# Patient Record
Sex: Female | Born: 1960 | Hispanic: No | Marital: Married | Smoking: Never smoker
Health system: Southern US, Community
[De-identification: ages and names within clinical notes are randomized; demographics above are authoritative.]

## PROBLEM LIST (undated history)

## (undated) DIAGNOSIS — I10 Essential (primary) hypertension: Secondary | ICD-10-CM

## (undated) DIAGNOSIS — N6019 Diffuse cystic mastopathy of unspecified breast: Secondary | ICD-10-CM

## (undated) DIAGNOSIS — K5900 Constipation, unspecified: Secondary | ICD-10-CM

## (undated) DIAGNOSIS — M8580 Other specified disorders of bone density and structure, unspecified site: Secondary | ICD-10-CM

## (undated) DIAGNOSIS — E559 Vitamin D deficiency, unspecified: Secondary | ICD-10-CM

## (undated) DIAGNOSIS — K219 Gastro-esophageal reflux disease without esophagitis: Secondary | ICD-10-CM

## (undated) DIAGNOSIS — Z860100 Personal history of colon polyps, unspecified: Secondary | ICD-10-CM

## (undated) HISTORY — DX: Gastro-esophageal reflux disease without esophagitis: K21.9

## (undated) HISTORY — DX: Constipation, unspecified: K59.00

## (undated) HISTORY — PX: BREAST BIOPSY: SHX20

## (undated) HISTORY — PX: COLONOSCOPY: SHX174

## (undated) HISTORY — PX: BREAST CYST EXCISION: SHX579

## (undated) HISTORY — DX: Personal history of colonic polyps: Z86.010

## (undated) HISTORY — DX: Vitamin D deficiency, unspecified: E55.9

## (undated) HISTORY — DX: Other specified disorders of bone density and structure, unspecified site: M85.80

## (undated) HISTORY — DX: Diffuse cystic mastopathy of unspecified breast: N60.19

## (undated) HISTORY — DX: Personal history of colon polyps, unspecified: Z86.0100

---

## 1998-05-26 ENCOUNTER — Other Ambulatory Visit: Admission: RE | Admit: 1998-05-26 | Discharge: 1998-05-26 | Payer: Self-pay

## 1999-07-26 ENCOUNTER — Other Ambulatory Visit: Admission: RE | Admit: 1999-07-26 | Discharge: 1999-07-26 | Payer: Self-pay | Admitting: Family Medicine

## 2000-06-13 ENCOUNTER — Encounter: Admission: RE | Admit: 2000-06-13 | Discharge: 2000-06-13 | Payer: Self-pay | Admitting: Family Medicine

## 2000-06-13 ENCOUNTER — Encounter: Payer: Self-pay | Admitting: Family Medicine

## 2000-11-14 ENCOUNTER — Other Ambulatory Visit: Admission: RE | Admit: 2000-11-14 | Discharge: 2000-11-14 | Payer: Self-pay | Admitting: Family Medicine

## 2002-12-18 ENCOUNTER — Encounter: Admission: RE | Admit: 2002-12-18 | Discharge: 2002-12-18 | Payer: Self-pay | Admitting: Family Medicine

## 2004-08-24 ENCOUNTER — Other Ambulatory Visit: Admission: RE | Admit: 2004-08-24 | Discharge: 2004-08-24 | Payer: Self-pay | Admitting: Family Medicine

## 2005-12-01 ENCOUNTER — Other Ambulatory Visit: Admission: RE | Admit: 2005-12-01 | Discharge: 2005-12-01 | Payer: Self-pay | Admitting: Family Medicine

## 2005-12-13 ENCOUNTER — Encounter: Admission: RE | Admit: 2005-12-13 | Discharge: 2005-12-13 | Payer: Self-pay | Admitting: Family Medicine

## 2007-02-19 ENCOUNTER — Other Ambulatory Visit: Admission: RE | Admit: 2007-02-19 | Discharge: 2007-02-19 | Payer: Self-pay | Admitting: Family Medicine

## 2007-10-25 ENCOUNTER — Encounter: Admission: RE | Admit: 2007-10-25 | Discharge: 2007-10-25 | Payer: Self-pay | Admitting: Family Medicine

## 2008-05-05 ENCOUNTER — Other Ambulatory Visit: Admission: RE | Admit: 2008-05-05 | Discharge: 2008-05-05 | Payer: Self-pay | Admitting: Family Medicine

## 2008-12-17 ENCOUNTER — Encounter: Admission: RE | Admit: 2008-12-17 | Discharge: 2008-12-17 | Payer: Self-pay | Admitting: Family Medicine

## 2010-02-01 ENCOUNTER — Encounter
Admission: RE | Admit: 2010-02-01 | Discharge: 2010-02-01 | Payer: Self-pay | Source: Home / Self Care | Attending: Family Medicine | Admitting: Family Medicine

## 2011-02-23 ENCOUNTER — Other Ambulatory Visit: Payer: Self-pay | Admitting: Family Medicine

## 2011-02-23 DIAGNOSIS — Z1231 Encounter for screening mammogram for malignant neoplasm of breast: Secondary | ICD-10-CM

## 2011-03-08 ENCOUNTER — Ambulatory Visit: Payer: Self-pay

## 2011-03-21 ENCOUNTER — Ambulatory Visit
Admission: RE | Admit: 2011-03-21 | Discharge: 2011-03-21 | Disposition: A | Discharge status: Home / Self Care | Payer: BC Managed Care – PPO | Source: Ambulatory Visit | Attending: Family Medicine | Admitting: Family Medicine

## 2011-03-21 DIAGNOSIS — Z1231 Encounter for screening mammogram for malignant neoplasm of breast: Secondary | ICD-10-CM

## 2011-05-04 ENCOUNTER — Other Ambulatory Visit (HOSPITAL_COMMUNITY)
Admission: RE | Admit: 2011-05-04 | Discharge: 2011-05-04 | Disposition: A | Discharge status: Home / Self Care | Payer: BC Managed Care – PPO | Source: Ambulatory Visit | Attending: Family Medicine | Admitting: Family Medicine

## 2011-05-04 ENCOUNTER — Other Ambulatory Visit: Payer: Self-pay | Admitting: Physician Assistant

## 2011-05-04 DIAGNOSIS — Z0000 Encounter for general adult medical examination without abnormal findings: Secondary | ICD-10-CM | POA: Insufficient documentation

## 2012-04-06 ENCOUNTER — Other Ambulatory Visit: Payer: Self-pay

## 2012-04-06 DIAGNOSIS — Z1231 Encounter for screening mammogram for malignant neoplasm of breast: Secondary | ICD-10-CM

## 2012-05-01 ENCOUNTER — Ambulatory Visit
Admission: RE | Admit: 2012-05-01 | Discharge: 2012-05-01 | Disposition: A | Discharge status: Home / Self Care | Payer: BC Managed Care – PPO | Source: Ambulatory Visit

## 2012-05-01 DIAGNOSIS — Z1231 Encounter for screening mammogram for malignant neoplasm of breast: Secondary | ICD-10-CM

## 2013-06-05 ENCOUNTER — Other Ambulatory Visit: Payer: Self-pay

## 2013-06-05 DIAGNOSIS — Z1231 Encounter for screening mammogram for malignant neoplasm of breast: Secondary | ICD-10-CM

## 2013-06-14 ENCOUNTER — Encounter (INDEPENDENT_AMBULATORY_CARE_PROVIDER_SITE_OTHER): Payer: Self-pay

## 2013-06-14 ENCOUNTER — Ambulatory Visit
Admission: RE | Admit: 2013-06-14 | Discharge: 2013-06-14 | Disposition: A | Discharge status: Home / Self Care | Payer: BC Managed Care – PPO | Source: Ambulatory Visit

## 2013-06-14 DIAGNOSIS — Z1231 Encounter for screening mammogram for malignant neoplasm of breast: Secondary | ICD-10-CM

## 2014-03-06 ENCOUNTER — Emergency Department (HOSPITAL_COMMUNITY)
Admission: EM | Admit: 2014-03-06 | Discharge: 2014-03-06 | Disposition: A | Discharge status: Home / Self Care | Payer: BLUE CROSS/BLUE SHIELD | Attending: Emergency Medicine | Admitting: Emergency Medicine

## 2014-03-06 ENCOUNTER — Emergency Department (HOSPITAL_COMMUNITY): Payer: BLUE CROSS/BLUE SHIELD

## 2014-03-06 ENCOUNTER — Encounter (HOSPITAL_COMMUNITY): Payer: Self-pay | Admitting: Emergency Medicine

## 2014-03-06 DIAGNOSIS — S0990XA Unspecified injury of head, initial encounter: Secondary | ICD-10-CM | POA: Diagnosis present

## 2014-03-06 DIAGNOSIS — Y998 Other external cause status: Secondary | ICD-10-CM | POA: Insufficient documentation

## 2014-03-06 DIAGNOSIS — W1830XA Fall on same level, unspecified, initial encounter: Secondary | ICD-10-CM | POA: Insufficient documentation

## 2014-03-06 DIAGNOSIS — Y92009 Unspecified place in unspecified non-institutional (private) residence as the place of occurrence of the external cause: Secondary | ICD-10-CM | POA: Diagnosis not present

## 2014-03-06 DIAGNOSIS — S0081XA Abrasion of other part of head, initial encounter: Secondary | ICD-10-CM | POA: Insufficient documentation

## 2014-03-06 DIAGNOSIS — E876 Hypokalemia: Secondary | ICD-10-CM | POA: Insufficient documentation

## 2014-03-06 DIAGNOSIS — Y9389 Activity, other specified: Secondary | ICD-10-CM | POA: Diagnosis not present

## 2014-03-06 DIAGNOSIS — W19XXXA Unspecified fall, initial encounter: Secondary | ICD-10-CM

## 2014-03-06 DIAGNOSIS — I10 Essential (primary) hypertension: Secondary | ICD-10-CM | POA: Diagnosis not present

## 2014-03-06 DIAGNOSIS — S0083XA Contusion of other part of head, initial encounter: Secondary | ICD-10-CM | POA: Diagnosis not present

## 2014-03-06 DIAGNOSIS — T502X5A Adverse effect of carbonic-anhydrase inhibitors, benzothiadiazides and other diuretics, initial encounter: Secondary | ICD-10-CM

## 2014-03-06 HISTORY — DX: Essential (primary) hypertension: I10

## 2014-03-06 LAB — BASIC METABOLIC PANEL
Anion gap: 8 (ref 5–15)
BUN: 15 mg/dL (ref 6–23)
CALCIUM: 9.2 mg/dL (ref 8.4–10.5)
CO2: 29 mmol/L (ref 19–32)
CREATININE: 0.75 mg/dL (ref 0.50–1.10)
Chloride: 100 mmol/L (ref 96–112)
GFR calc non Af Amer: >90 mL/min (ref >90)
Glucose, Bld: 117 mg/dL — ABNORMAL HIGH (ref 70–99)
Potassium: 3 mmol/L — ABNORMAL LOW (ref 3.5–5.1)
Sodium: 137 mmol/L (ref 135–145)

## 2014-03-06 LAB — CBC WITH DIFFERENTIAL/PLATELET
BASOS PCT: 0 % (ref 0–1)
Basophils Absolute: 0 10*3/uL (ref 0.0–0.1)
EOS ABS: 0.1 10*3/uL (ref 0.0–0.7)
Eosinophils Relative: 1 % (ref 0–5)
HCT: 40.2 % (ref 36.0–46.0)
Hemoglobin: 13.4 g/dL (ref 12.0–15.0)
Lymphocytes Relative: 31 % (ref 12–46)
Lymphs Abs: 3.3 10*3/uL (ref 0.7–4.0)
MCH: 28.9 pg (ref 26.0–34.0)
MCHC: 33.3 g/dL (ref 30.0–36.0)
MCV: 86.8 fL (ref 78.0–100.0)
MONOS PCT: 8 % (ref 3–12)
Monocytes Absolute: 0.8 10*3/uL (ref 0.1–1.0)
Neutro Abs: 6.5 10*3/uL (ref 1.7–7.7)
Neutrophils Relative %: 60 % (ref 43–77)
PLATELETS: 399 10*3/uL (ref 150–400)
RBC: 4.63 MIL/uL (ref 3.87–5.11)
RDW: 13.7 % (ref 11.5–15.5)
WBC: 10.8 10*3/uL — ABNORMAL HIGH (ref 4.0–10.5)

## 2014-03-06 LAB — TROPONIN I
Troponin I: <0.03 ng/mL (ref <0.031)
Troponin I: <0.03 ng/mL (ref <0.031)

## 2014-03-06 LAB — CBG MONITORING, ED: Glucose-Capillary: 114 mg/dL — ABNORMAL HIGH (ref 70–99)

## 2014-03-06 MED ORDER — POTASSIUM CHLORIDE CRYS ER 20 MEQ PO TBCR
40.0000 meq | EXTENDED_RELEASE_TABLET | Freq: Once | ORAL | Status: AC
  Administered 2014-03-06: 40 meq via ORAL
  Filled 2014-03-06: qty 2

## 2014-03-06 MED ORDER — POTASSIUM CHLORIDE CRYS ER 20 MEQ PO TBCR: 20.0000 meq | EXTENDED_RELEASE_TABLET | Freq: Two times a day (BID) | ORAL | Status: AC

## 2014-03-06 MED ORDER — POTASSIUM CHLORIDE 10 MEQ/100ML IV SOLN
10.0000 meq | Freq: Once | INTRAVENOUS | Status: AC
  Administered 2014-03-06: 10 meq via INTRAVENOUS
  Filled 2014-03-06: qty 100

## 2014-03-06 NOTE — Discharge Instructions (Signed)
Contusion A contusion is a deep bruise. Contusions are the result of an injury that caused bleeding under the skin. The contusion may turn blue, purple, or yellow. Minor injuries will give you a painless contusion, but more severe contusions may stay painful and swollen for a few weeks.  CAUSES  A contusion is usually caused by a blow, trauma, or direct force to an area of the body. SYMPTOMS   Swelling and redness of the injured area.  Bruising of the injured area.  Tenderness and soreness of the injured area.  Pain. DIAGNOSIS  The diagnosis can be made by taking a history and physical exam. An X-ray, CT scan, or MRI may be needed to determine if there were any associated injuries, such as fractures. TREATMENT  Specific treatment will depend on what area of the body was injured. In general, the best treatment for a contusion is resting, icing, elevating, and applying cold compresses to the injured area. Over-the-counter medicines may also be recommended for pain control. Ask your caregiver what the best treatment is for your contusion. HOME CARE INSTRUCTIONS   Put ice on the injured area.  Put ice in a plastic bag.  Place a towel between your skin and the bag.  Leave the ice on for 15-20 minutes, 3-4 times a day, or as directed by your health care provider.  Only take over-the-counter or prescription medicines for pain, discomfort, or fever as directed by your caregiver. Your caregiver may recommend avoiding anti-inflammatory medicines (aspirin, ibuprofen, and naproxen) for 48 hours because these medicines may increase bruising.  Rest the injured area.  If possible, elevate the injured area to reduce swelling. SEEK IMMEDIATE MEDICAL CARE IF:   You have increased bruising or swelling.  You have pain that is getting worse.  Your swelling or pain is not relieved with medicines. MAKE SURE YOU:   Understand these instructions.  Will watch your condition.  Will get help right  away if you are not doing well or get worse. Document Released: 10/20/2004 Document Revised: 01/15/2013 Document Reviewed: 11/15/2010 [facility] Patient Information 2015 [place], [place]. This information is not intended to replace advice given to you by your health care provider. Make sure you discuss any questions you have with your health care provider.  Fall Prevention and Home Safety Falls cause injuries and can affect all age groups. It is possible to use preventive measures to significantly decrease the likelihood of falls. There are many simple measures which can make your home safer and prevent falls. OUTDOORS  Repair cracks and edges of walkways and driveways.  Remove high doorway thresholds.  Trim shrubbery on the main path into your home.  Have good outside lighting.  Clear walkways of tools, rocks, debris, and clutter.  Check that handrails are not broken and are securely fastened. Both sides of steps should have handrails.  Have leaves, snow, and ice cleared regularly.  Use sand or salt on walkways during winter months.  In the garage, clean up grease or oil spills. BATHROOM  Install night lights.  Install grab bars by the toilet and in the tub and shower.  Use non-skid mats or decals in the tub or shower.  Place a plastic non-slip stool in the shower to sit on, if needed.  Keep floors dry and clean up all water on the floor immediately.  Remove soap buildup in the tub or shower on a regular basis.  Secure bath mats with non-slip, double-sided rug tape.  Remove throw rugs and tripping  hazards from the floors. BEDROOMS  Install night lights.  Make sure a bedside light is easy to reach.  Do not use oversized bedding.  Keep a telephone by your bedside.  Have a firm chair with side arms to use for getting dressed.  Remove throw rugs and tripping hazards from the floor. KITCHEN  Keep handles on pots and pans turned toward the center of the stove. Use  back burners when possible.  Clean up spills quickly and allow time for drying.  Avoid walking on wet floors.  Avoid hot utensils and knives.  Position shelves so they are not too high or low.  Place commonly used objects within easy reach.  If necessary, use a sturdy step stool with a grab bar when reaching.  Keep electrical cables out of the way.  Do not use floor polish or wax that makes floors slippery. If you must use wax, use non-skid floor wax.  Remove throw rugs and tripping hazards from the floor. STAIRWAYS  Never leave objects on stairs.  Place handrails on both sides of stairways and use them. Fix any loose handrails. Make sure handrails on both sides of the stairways are as long as the stairs.  Check carpeting to make sure it is firmly attached along stairs. Make repairs to worn or loose carpet promptly.  Avoid placing throw rugs at the top or bottom of stairways, or properly secure the rug with carpet tape to prevent slippage. Get rid of throw rugs, if possible.  Have an electrician put in a light switch at the top and bottom of the stairs. OTHER FALL PREVENTION TIPS  Wear low-heel or rubber-soled shoes that are supportive and fit well. Wear closed toe shoes.  When using a stepladder, make sure it is fully opened and both spreaders are firmly locked. Do not climb a closed stepladder.  Add color or contrast paint or tape to grab bars and handrails in your home. Place contrasting color strips on first and last steps.  Learn and use mobility aids as needed. Install an electrical emergency response system.  Turn on lights to avoid dark areas. Replace light bulbs that burn out immediately. Get light switches that glow.  Arrange furniture to create clear pathways. Keep furniture in the same place.  Firmly attach carpet with non-skid or double-sided tape.  Eliminate uneven floor surfaces.  Select a carpet pattern that does not visually hide the edge of  steps.  Be aware of all pets. OTHER HOME SAFETY TIPS  Set the water temperature for 120 F (48.8 C).  Keep emergency numbers on or near the telephone.  Keep smoke detectors on every level of the home and near sleeping areas. Document Released: 12/31/2001 Document Revised: 07/12/2011 Document Reviewed: 04/01/2011 [facility] Patient Information 2015 [place], [place]. This information is not intended to replace advice given to you by your health care provider. Make sure you discuss any questions you have with your health care provider.  Syncope Syncope is a medical term for fainting or passing out. This means you lose consciousness and drop to the ground. People are generally unconscious for less than 5 minutes. You may have some muscle twitches for up to 15 seconds before waking up and returning to normal. Syncope occurs more often in older adults, but it can happen to anyone. While most causes of syncope are not dangerous, syncope can be a sign of a serious medical problem. It is important to seek medical care.  CAUSES  Syncope is caused by a  sudden drop in blood flow to the brain. The specific cause is often not determined. Factors that can bring on syncope include:  Taking medicines that lower blood pressure.  Sudden changes in posture, such as standing up quickly.  Taking more medicine than prescribed.  Standing in one place for too long.  Seizure disorders.  Dehydration and excessive exposure to heat.  Low blood sugar (hypoglycemia).  Straining to have a bowel movement.  Heart disease, irregular heartbeat, or other circulatory problems.  Fear, emotional distress, seeing blood, or severe pain. SYMPTOMS  Right before fainting, you may:  Feel dizzy or light-headed.  Feel nauseous.  See all white or all black in your field of vision.  Have cold, clammy skin. DIAGNOSIS  Your health care provider will ask about your symptoms, perform a physical exam, and perform an  electrocardiogram (ECG) to record the electrical activity of your heart. Your health care provider may also perform other heart or blood tests to determine the cause of your syncope which may include:  Transthoracic echocardiogram (TTE). During echocardiography, sound waves are used to evaluate how blood flows through your heart.  Transesophageal echocardiogram (TEE).  Cardiac monitoring. This allows your health care provider to monitor your heart rate and rhythm in real time.  Holter monitor. This is a portable device that records your heartbeat and can help diagnose heart arrhythmias. It allows your health care provider to track your heart activity for several days, if needed.  Stress tests by exercise or by giving medicine that makes the heart beat faster. TREATMENT  In most cases, no treatment is needed. Depending on the cause of your syncope, your health care provider may recommend changing or stopping some of your medicines. HOME CARE INSTRUCTIONS  Have someone stay with you until you feel stable.  Do not drive, use machinery, or play sports until your health care provider says it is okay.  Keep all follow-up appointments as directed by your health care provider.  Lie down right away if you start feeling like you might faint. Breathe deeply and steadily. Wait until all the symptoms have passed.  Drink enough fluids to keep your urine clear or pale yellow.  If you are taking blood pressure or heart medicine, get up slowly and take several minutes to sit and then stand. This can reduce dizziness. SEEK IMMEDIATE MEDICAL CARE IF:   You have a severe headache.  You have unusual pain in the chest, abdomen, or back.  You are bleeding from your mouth or rectum, or you have black or tarry stool.  You have an irregular or very fast heartbeat.  You have pain with breathing.  You have repeated fainting or seizure-like jerking during an episode.  You faint when sitting or lying  down.  You have confusion.  You have trouble walking.  You have severe weakness.  You have vision problems. If you fainted, call your local emergency services (911 in [place] Do not drive yourself to the hospital.  MAKE SURE YOU:  Understand these instructions.  Will watch your condition.  Will get help right away if you are not doing well or get worse. Document Released: 01/10/2005 Document Revised: 01/15/2013 Document Reviewed: 03/11/2011 [facility] Patient Information 2015 [place], [place]. This information is not intended to replace advice given to you by your health care provider. Make sure you discuss any questions you have with your health care provider.  Hypokalemia Hypokalemia means that the amount of potassium in the blood is lower than normal.Potassium is  a chemical, called an electrolyte, that helps regulate the amount of fluid in the body. It also stimulates muscle contraction and helps nerves function properly.Most of the body's potassium is inside of cells, and only a very small amount is in the blood. Because the amount in the blood is so small, minor changes can be life-threatening. CAUSES  Antibiotics.  Diarrhea or vomiting.  Using laxatives too much, which can cause diarrhea.  Chronic kidney disease.  Water pills (diuretics).  Eating disorders (bulimia).  Low magnesium level.  Sweating a lot. SIGNS AND SYMPTOMS  Weakness.  Constipation.  Fatigue.  Muscle cramps.  Mental confusion.  Skipped heartbeats or irregular heartbeat (palpitations).  Tingling or numbness. DIAGNOSIS  Your health care provider can diagnose hypokalemia with blood tests. In addition to checking your potassium level, your health care provider may also check other lab tests. TREATMENT Hypokalemia can be treated with potassium supplements taken by mouth or adjustments in your current medicines. If your potassium level is very low, you may need to get potassium through a vein  (IV) and be monitored in the hospital. A diet high in potassium is also helpful. Foods high in potassium are:  Nuts, such as peanuts and pistachios.  Seeds, such as sunflower seeds and pumpkin seeds.  Peas, lentils, and lima beans.  Whole grain and bran cereals and breads.  Fresh fruit and vegetables, such as apricots, avocado, bananas, cantaloupe, kiwi, oranges, tomatoes, asparagus, and potatoes.  Orange and tomato juices.  Red meats.  Fruit yogurt. HOME CARE INSTRUCTIONS  Take all medicines as prescribed by your health care provider.  Maintain a healthy diet by including nutritious food, such as fruits, vegetables, nuts, whole grains, and lean meats.  If you are taking a laxative, be sure to follow the directions on the label. SEEK MEDICAL CARE IF:  Your weakness gets worse.  You feel your heart pounding or racing.  You are vomiting or having diarrhea.  You are diabetic and having trouble keeping your blood glucose in the normal range. SEEK IMMEDIATE MEDICAL CARE IF:  You have chest pain, shortness of breath, or dizziness.  You are vomiting or having diarrhea for more than 2 days.  You faint. MAKE SURE YOU:   Understand these instructions.  Will watch your condition.  Will get help right away if you are not doing well or get worse. Document Released: 01/10/2005 Document Revised: 10/31/2012 Document Reviewed: 07/13/2012 [facility] Patient Information 2015 [place], [place]. This information is not intended to replace advice given to you by your health care provider. Make sure you discuss any questions you have with your health care provider.  Potassium Salts tablets, extended-release tablets or capsules What is this medicine? POTASSIUM (poe TASS i um) is a natural salt that is important for the heart, muscles, and nerves. It is found in many foods and is normally supplied by a well balanced diet. This medicine is used to treat low potassium. This medicine may be used  for other purposes; ask your health care provider or pharmacist if you have questions. COMMON BRAND NAME(S): ED-K+10, Glu-K, K-10, K-8, K-Dur, K-Tab, Kaon-CL, Klor-Con, Klor-Con M10, Klor-Con M15, Klor-Con M20, Klotrix, Micro-K, Micro-K Extencaps, Slow-K What should I tell my health care provider before I take this medicine? They need to know if you have any of these conditions: -dehydration -diabetes -irregular heartbeat -kidney disease -stomach ulcers or other stomach problems -an unusual or allergic reaction to potassium salts, other medicines, foods, dyes, or preservatives -pregnant or trying to get  pregnant -breast-feeding How should I use this medicine? Take this medicine by mouth with a full glass of water. Follow the directions on the prescription label. Take with food. Do not suck on, crush, or chew this medicine. If you have difficulty swallowing, ask the pharmacist how to take. Take your medicine at regular intervals. Do not take it more often than directed. Do not stop taking except on your doctor's advice. Talk to your pediatrician regarding the use of this medicine in children. Special care may be needed. Overdosage: If you think you have taken too much of this medicine contact a poison control center or emergency room at once. NOTE: This medicine is only for you. Do not share this medicine with others. What if I miss a dose? If you miss a dose, take it as soon as you can. If it is almost time for your next dose, take only that dose. Do not take double or extra doses. What may interact with this medicine? Do not take this medicine with any of the following medications: -eplerenone -sodium polystyrene sulfonate This medicine may also interact with the following medications: -medicines for blood pressure or heart disease like lisinopril, losartan, quinapril, valsartan -medicines for cold or allergies -medicines for inflammation like ibuprofen, indomethacin -medicines for  Parkinson's disease -medicines for the stomach like metoclopramide, dicyclomine, glycopyrrolate -some diuretics This list may not describe all possible interactions. Give your health care provider a list of all the medicines, herbs, non-prescription drugs, or dietary supplements you use. Also tell them if you smoke, drink alcohol, or use illegal drugs. Some items may interact with your medicine. What should I watch for while using this medicine? Visit your doctor or health care professional for regular check ups. You will need lab work done regularly. You may need to be on a special diet while taking this medicine. Ask your doctor. What side effects may I notice from receiving this medicine? Side effects that you should report to your doctor or health care professional as soon as possible: -allergic reactions like skin rash, itching or hives, swelling of the face, lips, or tongue -black, tarry stools -heartburn -irregular heartbeat -numbness or tingling in hands or feet -pain when swallowing -unusually weak or tired Side effects that usually do not require medical attention (report to your doctor or health care professional if they continue or are bothersome): -diarrhea -nausea -stomach gas -vomiting This list may not describe all possible side effects. Call your doctor for medical advice about side effects. You may report side effects to FDA at 1-800-FDA-1088. Where should I keep my medicine? Keep out of the reach of children. Store at room temperature between 15 and 30 degrees C (59 and 86 degrees F ). Keep bottle closed tightly to protect this medicine from light and moisture. Throw away any unused medicine after the expiration date. NOTE: This sheet is a summary. It may not cover all possible information. If you have questions about this medicine, talk to your doctor, pharmacist, or health care provider.  2015, [employer]. (2007-03-28 11:17:31)

## 2014-03-06 NOTE — ED Notes (Signed)
Called lab.  They stated troponin is 0.03 and that they will enter results in computer shortly.

## 2014-03-06 NOTE — ED Notes (Signed)
Dr. Glick at the bedside.  

## 2014-03-06 NOTE — ED Provider Notes (Signed)
0700 - Care from Dr. Preston FleetingGlick. Here with syncope. Awaiting 2nd troponin. 0845 - 2nd troponin negative. Stable for discharge.  Forehead contusion, initial encounter  Fall at home, initial encounter - Plan: CT Head Wo Contrast, CT Cervical Spine Wo Contrast, DG Chest 2 View, CT Head Wo Contrast, CT Cervical Spine Wo Contrast, DG Chest 2 View  Diuretic-induced hypokalemia    Rachel MochaBlair Kaylen Nghiem, MD 03/06/14 [phone]

## 2014-03-06 NOTE — ED Notes (Signed)
Patient reports remembering locking the door around 3am today, and woke up face down today on the floor. Only Hx reported was HTN. Patient alert and oriented on arrival, abrasion to left forehead.

## 2014-03-06 NOTE — ED Provider Notes (Signed)
CSN: [id number]     Arrival date & time 03/06/14  0459 History   First MD Initiated Contact with Patient 03/06/14 [id number]     Chief Complaint  Patient presents with  . Fall     (Consider location/radiation/quality/duration/timing/severity/associated sxs/prior Treatment) Patient is a 54 y.o. female presenting with fall. The history is provided by the patient.  Fall  She states that she remembers looking out the window in the next thing she knew, she was on the floor. She is uncertain how long she was unconscious for. There was no chest pain, heaviness, tightness, pressure. She denied any palpitations. She denied nausea, vomiting, diaphoresis. After waking up, she did state that she felt hot but no other symptoms. She denies bowel or bladder incontinence and denies bit lip or tongue.  Past Medical History  Diagnosis Date  . Hypertension    History reviewed. No pertinent past surgical history. History reviewed. No pertinent family history. History  Substance Use Topics  . Smoking status: Never Smoker   . Smokeless tobacco: Not on file  . Alcohol Use: No   OB History    No data available     Review of Systems  All other systems reviewed and are negative.     Allergies  Review of patient's allergies indicates no known allergies.  Home Medications   Prior to Admission medications   Not on File   BP 138/93 mmHg  Pulse 88  Temp(Src) 97.9 F (36.6 C) (Oral)  Resp 12  Ht  (1.575 m)  Wt 144 lb (65.318 kg)  BMI 26.33 kg/m2  SpO2 100%  LMP  Physical Exam  Nursing note and vitals reviewed.  54 year old female, resting comfortably and in no acute distress. Vital signs are significant for borderline hypertension. Oxygen saturation is 100%, which is normal. Head is normocephalic. Minor abrasion and hematoma is present on the left side of the forehead. PERRLA, EOMI. Oropharynx is clear. Neck is nontender without adenopathy or JVD. Back is nontender and there is no CVA  tenderness. Lungs are clear without rales, wheezes, or rhonchi. Chest is nontender. Heart has regular rate and rhythm without murmur. Abdomen is soft, flat, nontender without masses or hepatosplenomegaly and peristalsis is normoactive. Extremities have no cyanosis or edema, full range of motion is present. Skin is warm and dry without rash. Neurologic: Mental status is normal, cranial nerves are intact, there are no motor or sensory deficits.  ED Course  Procedures (including critical care time) Labs Review Results for orders placed or performed during the hospital encounter of 03/06/14  Basic metabolic panel  Result Value Ref Range   Sodium 137 135 - 145 mmol/L   Potassium 3.0 (L) 3.5 - 5.1 mmol/L   Chloride 100 96 - 112 mmol/L   CO2 29 19 - 32 mmol/L   Glucose, Bld 117 (H) 70 - 99 mg/dL   BUN 15 6 - 23 mg/dL   Creatinine, Ser 7.84 0.50 - 1.10 mg/dL   Calcium 9.2 8.4 - 69.6 mg/dL   GFR calc non Af Amer >90 >90 mL/min   GFR calc Af Amer >90 >90 mL/min   Anion gap 8 5 - 15  Troponin I  Result Value Ref Range   Troponin I <0.03 <0.031 ng/mL  CBC with Differential  Result Value Ref Range   WBC 10.8 (H) 4.0 - 10.5 K/uL   RBC 4.63 3.87 - 5.11 MIL/uL   Hemoglobin 13.4 12.0 - 15.0 g/dL   HCT 29.5 28.4 -  46.0 %   MCV 86.8 78.0 - 100.0 fL   MCH 28.9 26.0 - 34.0 pg   MCHC 33.3 30.0 - 36.0 g/dL   RDW 09.813.7 11.911.5 - 14.715.5 %   Platelets 399 150 - 400 K/uL   Neutrophils Relative % 60 43 - 77 %   Neutro Abs 6.5 1.7 - 7.7 K/uL   Lymphocytes Relative 31 12 - 46 %   Lymphs Abs 3.3 0.7 - 4.0 K/uL   Monocytes Relative 8 3 - 12 %   Monocytes Absolute 0.8 0.1 - 1.0 K/uL   Eosinophils Relative 1 0 - 5 %   Eosinophils Absolute 0.1 0.0 - 0.7 K/uL   Basophils Relative 0 0 - 1 %   Basophils Absolute 0.0 0.0 - 0.1 K/uL  CBG monitoring, ED  Result Value Ref Range   Glucose-Capillary 114 (H) 70 - 99 mg/dL   Imaging Review Dg Chest 2 View  03/06/2014   CLINICAL DATA:  Status post fall; syncope.  Hit head. Concern for chest injury. Initial encounter.  EXAM: CHEST  2 VIEW  COMPARISON:  None.  FINDINGS: The lungs are well-aerated. Mild peribronchial thickening is noted. There is no evidence of focal opacification, pleural effusion or pneumothorax.  The heart is borderline normal in size; the mediastinal contour is within normal limits. No acute osseous abnormalities are seen.  IMPRESSION: Mild peribronchial thickening noted; lungs otherwise clear. No displaced rib fracture seen.   Electronically Signed   By: Roanna RaiderJeffery  Chang M.D.   On: 03/06/2014 06:01   Ct Head Wo Contrast  03/06/2014   CLINICAL DATA:  Status post fall; injury above the left orbit. Concern for head or cervical spine injury. Initial encounter.  EXAM: CT HEAD WITHOUT CONTRAST  CT CERVICAL SPINE WITHOUT CONTRAST  TECHNIQUE: Multidetector CT imaging of the head and cervical spine was performed following the standard protocol without intravenous contrast. Multiplanar CT image reconstructions of the cervical spine were also generated.  COMPARISON:  None.  FINDINGS: CT HEAD FINDINGS  There is no evidence of acute infarction, mass lesion, or intra- or extra-axial hemorrhage on CT.  The posterior fossa, including the cerebellum, brainstem and fourth ventricle, is within normal limits. The third and lateral ventricles, and basal ganglia are unremarkable in appearance. The cerebral hemispheres are symmetric in appearance, with normal gray-white differentiation. No mass effect or midline shift is seen.  There is no evidence of fracture; visualized osseous structures are unremarkable in appearance. The visualized portions of the orbits are within normal limits. The paranasal sinuses and mastoid air cells are well-aerated. No significant soft tissue abnormalities are seen.  CT CERVICAL SPINE FINDINGS  There is no evidence of fracture or subluxation. Vertebral bodies demonstrate normal height and alignment. Minimal disc space narrowing is suggested along  the lower cervical spine, with small anterior and posterior disc osteophyte complexes noted at C5-C6. Prevertebral soft tissues are within normal limits.  The thyroid gland is unremarkable in appearance. The visualized lung apices are clear. No significant soft tissue abnormalities are seen.  IMPRESSION: 1. No evidence of traumatic intracranial injury or fracture. 2. No evidence of fracture or subluxation along the cervical spine.   Electronically Signed   By: Roanna RaiderJeffery  Chang M.D.   On: 03/06/2014 06:20   Ct Cervical Spine Wo Contrast  03/06/2014   CLINICAL DATA:  Status post fall; injury above the left orbit. Concern for head or cervical spine injury. Initial encounter.  EXAM: CT HEAD WITHOUT CONTRAST  CT CERVICAL SPINE WITHOUT CONTRAST  TECHNIQUE: Multidetector CT imaging of the head and cervical spine was performed following the standard protocol without intravenous contrast. Multiplanar CT image reconstructions of the cervical spine were also generated.  COMPARISON:  None.  FINDINGS: CT HEAD FINDINGS  There is no evidence of acute infarction, mass lesion, or intra- or extra-axial hemorrhage on CT.  The posterior fossa, including the cerebellum, brainstem and fourth ventricle, is within normal limits. The third and lateral ventricles, and basal ganglia are unremarkable in appearance. The cerebral hemispheres are symmetric in appearance, with normal gray-white differentiation. No mass effect or midline shift is seen.  There is no evidence of fracture; visualized osseous structures are unremarkable in appearance. The visualized portions of the orbits are within normal limits. The paranasal sinuses and mastoid air cells are well-aerated. No significant soft tissue abnormalities are seen.  CT CERVICAL SPINE FINDINGS  There is no evidence of fracture or subluxation. Vertebral bodies demonstrate normal height and alignment. Minimal disc space narrowing is suggested along the lower cervical spine, with small anterior  and posterior disc osteophyte complexes noted at C5-C6. Prevertebral soft tissues are within normal limits.  The thyroid gland is unremarkable in appearance. The visualized lung apices are clear. No significant soft tissue abnormalities are seen.  IMPRESSION: 1. No evidence of traumatic intracranial injury or fracture. 2. No evidence of fracture or subluxation along the cervical spine.   Electronically Signed   By: Roanna Raider M.D.   On: 03/06/2014 06:20     EKG Interpretation   Date/Time:  Thursday March 06 2014 05:09:54 EST Ventricular Rate:  91 PR Interval:  148 QRS Duration: 93 QT Interval:  362 QTC Calculation: 445 R Axis:   86 Text Interpretation:  Sinus rhythm Probable left atrial enlargement  Probable LVH with secondary repol abnrm No old tracing to compare  Confirmed by [facility]  MD, Cleona Doubleday ([id number]) on 03/06/2014 5:19:39 AM      MDM   Final diagnoses:  Fall at home, initial encounter  Forehead contusion, initial encounter  Diuretic-induced hypokalemia    Fall at home which may have been syncope versus fall with secondary head injury and concussion. She is sent for CT of the head and syncope workup initiated.  ED workup is negative with exception of hypokalemia. Patient takes hydrochlorothiazide for hypertension this is clearly the cause of her hypokalemia. Given the patient's for head injury, I feel it is far more likely that she tripped and fell rather than primary syncope. She is given intravenous and oral potassium in the ED. She will be kept in the ED for repeat troponin and discharged if negative. She will be given a prescription for K-Dur, and will need to discuss with PCP whether she needs ongoing potassium supplementation or switch to a potassium sparing diuretic.  Dione Booze, MD 03/06/14 [id number]

## 2015-02-04 ENCOUNTER — Other Ambulatory Visit: Payer: Self-pay

## 2015-02-04 DIAGNOSIS — Z1231 Encounter for screening mammogram for malignant neoplasm of breast: Secondary | ICD-10-CM

## 2015-02-16 ENCOUNTER — Other Ambulatory Visit: Payer: Self-pay | Admitting: Family Medicine

## 2015-02-16 ENCOUNTER — Ambulatory Visit
Admission: RE | Admit: 2015-02-16 | Discharge: 2015-02-16 | Disposition: A | Discharge status: Home / Self Care | Payer: BLUE CROSS/BLUE SHIELD | Source: Ambulatory Visit

## 2015-02-16 DIAGNOSIS — R928 Other abnormal and inconclusive findings on diagnostic imaging of breast: Secondary | ICD-10-CM

## 2015-02-16 DIAGNOSIS — Z1231 Encounter for screening mammogram for malignant neoplasm of breast: Secondary | ICD-10-CM

## 2015-02-24 ENCOUNTER — Ambulatory Visit
Admission: RE | Admit: 2015-02-24 | Discharge: 2015-02-24 | Disposition: A | Discharge status: Home / Self Care | Payer: BLUE CROSS/BLUE SHIELD | Source: Ambulatory Visit | Attending: Family Medicine | Admitting: Family Medicine

## 2015-02-24 DIAGNOSIS — R928 Other abnormal and inconclusive findings on diagnostic imaging of breast: Secondary | ICD-10-CM

## 2015-03-20 ENCOUNTER — Other Ambulatory Visit: Payer: Self-pay | Admitting: Gastroenterology

## 2015-03-20 ENCOUNTER — Ambulatory Visit (HOSPITAL_COMMUNITY)
Admission: RE | Admit: 2015-03-20 | Discharge: 2015-03-20 | Disposition: A | Discharge status: Home / Self Care | Payer: BLUE CROSS/BLUE SHIELD | Source: Ambulatory Visit | Attending: Gastroenterology | Admitting: Gastroenterology

## 2015-03-20 DIAGNOSIS — R933 Abnormal findings on diagnostic imaging of other parts of digestive tract: Secondary | ICD-10-CM | POA: Diagnosis not present

## 2015-03-20 DIAGNOSIS — Z1211 Encounter for screening for malignant neoplasm of colon: Secondary | ICD-10-CM | POA: Insufficient documentation

## 2015-08-28 ENCOUNTER — Other Ambulatory Visit: Payer: Self-pay | Admitting: Family Medicine

## 2015-08-28 DIAGNOSIS — N6489 Other specified disorders of breast: Secondary | ICD-10-CM

## 2015-08-28 DIAGNOSIS — N63 Unspecified lump in unspecified breast: Secondary | ICD-10-CM

## 2015-09-03 ENCOUNTER — Ambulatory Visit
Admission: RE | Admit: 2015-09-03 | Discharge: 2015-09-03 | Disposition: A | Discharge status: Home / Self Care | Payer: BLUE CROSS/BLUE SHIELD | Source: Ambulatory Visit | Attending: Family Medicine | Admitting: Family Medicine

## 2015-09-03 DIAGNOSIS — N6489 Other specified disorders of breast: Secondary | ICD-10-CM

## 2015-12-02 ENCOUNTER — Other Ambulatory Visit: Payer: Self-pay | Admitting: Family Medicine

## 2015-12-02 ENCOUNTER — Other Ambulatory Visit (HOSPITAL_COMMUNITY)
Admission: RE | Admit: 2015-12-02 | Discharge: 2015-12-02 | Disposition: A | Discharge status: Home / Self Care | Payer: BLUE CROSS/BLUE SHIELD | Source: Ambulatory Visit | Attending: Family Medicine | Admitting: Family Medicine

## 2015-12-02 DIAGNOSIS — Z01419 Encounter for gynecological examination (general) (routine) without abnormal findings: Secondary | ICD-10-CM | POA: Insufficient documentation

## 2015-12-04 LAB — CYTOLOGY - PAP: Diagnosis: NEGATIVE

## 2016-02-01 ENCOUNTER — Other Ambulatory Visit: Payer: Self-pay | Admitting: Family Medicine

## 2016-02-01 DIAGNOSIS — N6320 Unspecified lump in the left breast, unspecified quadrant: Secondary | ICD-10-CM

## 2016-02-17 ENCOUNTER — Ambulatory Visit
Admission: RE | Admit: 2016-02-17 | Discharge: 2016-02-17 | Disposition: A | Discharge status: Home / Self Care | Payer: BLUE CROSS/BLUE SHIELD | Source: Ambulatory Visit | Attending: Family Medicine | Admitting: Family Medicine

## 2016-02-17 DIAGNOSIS — N6320 Unspecified lump in the left breast, unspecified quadrant: Secondary | ICD-10-CM

## 2017-03-09 ENCOUNTER — Other Ambulatory Visit: Payer: Self-pay | Admitting: Family Medicine

## 2017-03-09 DIAGNOSIS — R921 Mammographic calcification found on diagnostic imaging of breast: Secondary | ICD-10-CM

## 2017-03-09 DIAGNOSIS — N6002 Solitary cyst of left breast: Secondary | ICD-10-CM

## 2017-03-15 ENCOUNTER — Ambulatory Visit
Admission: RE | Admit: 2017-03-15 | Discharge: 2017-03-15 | Disposition: A | Discharge status: Home / Self Care | Payer: BLUE CROSS/BLUE SHIELD | Source: Ambulatory Visit | Attending: Family Medicine | Admitting: Family Medicine

## 2017-03-15 ENCOUNTER — Other Ambulatory Visit: Payer: Self-pay | Admitting: Family Medicine

## 2017-03-15 DIAGNOSIS — N6002 Solitary cyst of left breast: Secondary | ICD-10-CM

## 2017-03-15 DIAGNOSIS — N6320 Unspecified lump in the left breast, unspecified quadrant: Secondary | ICD-10-CM

## 2017-03-15 DIAGNOSIS — R921 Mammographic calcification found on diagnostic imaging of breast: Secondary | ICD-10-CM

## 2017-03-15 DIAGNOSIS — R599 Enlarged lymph nodes, unspecified: Secondary | ICD-10-CM

## 2017-03-16 ENCOUNTER — Other Ambulatory Visit: Payer: BLUE CROSS/BLUE SHIELD

## 2017-03-20 ENCOUNTER — Other Ambulatory Visit: Payer: Self-pay | Admitting: Family Medicine

## 2017-03-20 ENCOUNTER — Ambulatory Visit
Admission: RE | Admit: 2017-03-20 | Discharge: 2017-03-20 | Disposition: A | Discharge status: Home / Self Care | Payer: BLUE CROSS/BLUE SHIELD | Source: Ambulatory Visit | Attending: Family Medicine | Admitting: Family Medicine

## 2017-03-20 DIAGNOSIS — N6320 Unspecified lump in the left breast, unspecified quadrant: Secondary | ICD-10-CM

## 2017-03-20 DIAGNOSIS — R599 Enlarged lymph nodes, unspecified: Secondary | ICD-10-CM

## 2017-11-01 ENCOUNTER — Other Ambulatory Visit: Payer: Self-pay | Admitting: Family Medicine

## 2017-11-01 DIAGNOSIS — N6012 Diffuse cystic mastopathy of left breast: Secondary | ICD-10-CM

## 2017-11-07 ENCOUNTER — Ambulatory Visit
Admission: RE | Admit: 2017-11-07 | Discharge: 2017-11-07 | Disposition: A | Discharge status: Home / Self Care | Payer: BLUE CROSS/BLUE SHIELD | Source: Ambulatory Visit | Attending: Family Medicine | Admitting: Family Medicine

## 2017-11-07 ENCOUNTER — Ambulatory Visit: Payer: BLUE CROSS/BLUE SHIELD

## 2017-11-07 DIAGNOSIS — N6012 Diffuse cystic mastopathy of left breast: Secondary | ICD-10-CM

## 2018-04-05 ENCOUNTER — Other Ambulatory Visit: Payer: Self-pay | Admitting: Family Medicine

## 2018-04-05 DIAGNOSIS — Z1231 Encounter for screening mammogram for malignant neoplasm of breast: Secondary | ICD-10-CM

## 2018-05-03 ENCOUNTER — Ambulatory Visit: Payer: BLUE CROSS/BLUE SHIELD

## 2018-06-15 ENCOUNTER — Ambulatory Visit: Payer: BLUE CROSS/BLUE SHIELD

## 2018-07-24 ENCOUNTER — Emergency Department (HOSPITAL_COMMUNITY)
Admission: EM | Admit: 2018-07-24 | Discharge: 2018-07-24 | Disposition: A | Discharge status: Home / Self Care | Payer: BC Managed Care – PPO | Attending: Emergency Medicine | Admitting: Emergency Medicine

## 2018-07-24 ENCOUNTER — Other Ambulatory Visit: Payer: Self-pay

## 2018-07-24 ENCOUNTER — Emergency Department (HOSPITAL_COMMUNITY): Payer: BC Managed Care – PPO

## 2018-07-24 ENCOUNTER — Encounter (HOSPITAL_COMMUNITY): Payer: Self-pay | Admitting: Emergency Medicine

## 2018-07-24 DIAGNOSIS — Z79899 Other long term (current) drug therapy: Secondary | ICD-10-CM | POA: Insufficient documentation

## 2018-07-24 DIAGNOSIS — I10 Essential (primary) hypertension: Secondary | ICD-10-CM | POA: Insufficient documentation

## 2018-07-24 DIAGNOSIS — R079 Chest pain, unspecified: Secondary | ICD-10-CM | POA: Insufficient documentation

## 2018-07-24 DIAGNOSIS — Z7982 Long term (current) use of aspirin: Secondary | ICD-10-CM | POA: Diagnosis not present

## 2018-07-24 LAB — CBC
HCT: 38.5 % (ref 36.0–46.0)
Hemoglobin: 12.6 g/dL (ref 12.0–15.0)
MCH: 28.3 pg (ref 26.0–34.0)
MCHC: 32.7 g/dL (ref 30.0–36.0)
MCV: 86.3 fL (ref 80.0–100.0)
Platelets: 405 10*3/uL — ABNORMAL HIGH (ref 150–400)
RBC: 4.46 MIL/uL (ref 3.87–5.11)
RDW: 13.4 % (ref 11.5–15.5)
WBC: 9.6 10*3/uL (ref 4.0–10.5)
nRBC: 0 % (ref 0.0–0.2)

## 2018-07-24 LAB — HEPATIC FUNCTION PANEL
ALT: 18 U/L (ref 0–44)
AST: 19 U/L (ref 15–41)
Albumin: 4 g/dL (ref 3.5–5.0)
Alkaline Phosphatase: 63 U/L (ref 38–126)
Bilirubin, Direct: <0.1 mg/dL (ref 0.0–0.2)
Total Bilirubin: 1.4 mg/dL — ABNORMAL HIGH (ref 0.3–1.2)
Total Protein: 7.6 g/dL (ref 6.5–8.1)

## 2018-07-24 LAB — I-STAT BETA HCG BLOOD, ED (MC, WL, AP ONLY): I-stat hCG, quantitative: <5 m[IU]/mL (ref <5)

## 2018-07-24 LAB — TROPONIN I (HIGH SENSITIVITY)
Troponin I (High Sensitivity): 3 ng/L (ref <18)
Troponin I (High Sensitivity): 4 ng/L (ref <18)

## 2018-07-24 LAB — BASIC METABOLIC PANEL
Anion gap: 10 (ref 5–15)
BUN: 8 mg/dL (ref 6–20)
CO2: 26 mmol/L (ref 22–32)
Calcium: 9.4 mg/dL (ref 8.9–10.3)
Chloride: 103 mmol/L (ref 98–111)
Creatinine, Ser: 0.68 mg/dL (ref 0.44–1.00)
GFR calc Af Amer: >60 mL/min (ref >60)
GFR calc non Af Amer: >60 mL/min (ref >60)
Glucose, Bld: 115 mg/dL — ABNORMAL HIGH (ref 70–99)
Potassium: 3.5 mmol/L (ref 3.5–5.1)
Sodium: 139 mmol/L (ref 135–145)

## 2018-07-24 LAB — LIPASE, BLOOD: Lipase: 23 U/L (ref 11–51)

## 2018-07-24 MED ORDER — SODIUM CHLORIDE 0.9% FLUSH: 3.0000 mL | Freq: Once | INTRAVENOUS | Status: DC

## 2018-07-24 MED ORDER — PANTOPRAZOLE SODIUM 40 MG PO TBEC: 40.0000 mg | DELAYED_RELEASE_TABLET | Freq: Every day | ORAL | 0 refills | Status: AC

## 2018-07-24 MED ORDER — LIDOCAINE VISCOUS HCL 2 % MT SOLN
15.0000 mL | Freq: Once | OROMUCOSAL | Status: AC
  Administered 2018-07-24: 15 mL via ORAL
  Filled 2018-07-24: qty 15

## 2018-07-24 MED ORDER — ALUM & MAG HYDROXIDE-SIMETH 200-200-20 MG/5ML PO SUSP
30.0000 mL | Freq: Once | ORAL | Status: AC
  Administered 2018-07-24: 30 mL via ORAL
  Filled 2018-07-24: qty 30

## 2018-07-24 NOTE — ED Triage Notes (Signed)
Patient with chest pain that started yesterday am, states that it was burning sensation, indigestion feeling.  2 days ago she had episodes of rapid heart rate.  NSR on monitor.  VSS.

## 2018-07-24 NOTE — ED Provider Notes (Signed)
[facility] Provider Note   CSN: [id number] Arrival date & time: 07/24/18  0413    History   Chief Complaint Chief Complaint  Patient presents with  . Chest Pain    HPI Rachel Cochran is a 58 y.o. female.  HPI: A 58 year old patient with a history of hypertension and hypercholesterolemia presents for evaluation of chest pain. Initial onset of pain was more than 6 hours ago. The patient's chest pain is not worse with exertion. The patient's chest pain is not middle- or left-sided, is not well-localized, is not described as heaviness/pressure/tightness, is not sharp and does not radiate to the arms/jaw/neck. The patient does not complain of nausea and denies diaphoresis. The patient has no history of stroke, has no history of peripheral artery disease, has not smoked in the past 90 days, denies any history of treated diabetes, has no relevant family history of coronary artery disease (first degree relative at less than age 60) and does not have an elevated BMI (>=30).   Patient presents to the emergency department for evaluation of chest discomfort.  She reports that symptoms began 2 days ago.  She thought she might of eaten something that disagreed with her, just felt like she had indigestion.  She had a slight burning sensation with increased belching.  Symptoms improved over time but that have come back.  She has been having waxing and waning symptoms.  There was an episode 1 day ago where she felt like her heart was racing but that has resolved.  She has not had any associated shortness of breath.  No nausea or diaphoresis.  Patient denies any history of cardiac disease.  No family history of early CAD.     Past Medical History:  Diagnosis Date  . Hypertension     There are no active problems to display for this patient.   Past Surgical History:  Procedure Laterality Date  . BREAST CYST EXCISION       OB History   No obstetric  history on file.      Home Medications    Prior to Admission medications   Medication Sig Start Date End Date Taking? Authorizing Provider  aspirin EC 81 MG tablet Take 81 mg by mouth daily.   Yes [provider]  atorvastatin (LIPITOR) 20 MG tablet Take 1 tablet by mouth daily.  03/01/14  Yes [provider]  Cholecalciferol (VITAMIN D PO) Take 1 tablet by mouth daily.   Yes [provider]  lisinopril-hydrochlorothiazide (ZESTORETIC) 10-12.5 MG tablet Take 1 tablet by mouth daily. 06/19/18  Yes [provider]  pantoprazole (PROTONIX) 40 MG tablet Take 1 tablet (40 mg total) by mouth daily. 07/24/18   Gilda CreasePollina, Abrie Egloff J, MD  potassium chloride SA (K-DUR,KLOR-CON) 20 MEQ tablet Take 1 tablet (20 mEq total) by mouth 2 (two) times daily. Patient not taking: Reported on 07/24/2018 03/06/14   Dione BoozeGlick, David, MD    Family History No family history on file.  Social History Social History   Tobacco Use  . Smoking status: Never Smoker  Substance Use Topics  . Alcohol use: No  . Drug use: Not Currently     Allergies   Patient has no known allergies.   Review of Systems Review of Systems  Cardiovascular: Positive for chest pain.  All other systems reviewed and are negative.    Physical Exam Updated Vital Signs BP 138/81   Pulse 75   Temp 98.1 F (36.7 C) (Oral)  Resp 14   Ht 5\' 2"  (1.575 m)   Wt 65.8 kg   SpO2 99%   BMI 26.52 kg/m   Physical Exam Vitals signs and nursing note reviewed.  Constitutional:      General: She is not in acute distress.    Appearance: Normal appearance. She is well-developed.  HENT:     Head: Normocephalic and atraumatic.     Right Ear: Hearing normal.     Left Ear: Hearing normal.     Nose: Nose normal.  Eyes:     Conjunctiva/sclera: Conjunctivae normal.     Pupils: Pupils are equal, round, and reactive to light.  Neck:     Musculoskeletal: Normal range of motion and neck supple.  Cardiovascular:      Rate and Rhythm: Regular rhythm.     Heart sounds: S1 normal and S2 normal. No murmur. No friction rub. No gallop.   Pulmonary:     Effort: Pulmonary effort is normal. No respiratory distress.     Breath sounds: Normal breath sounds.  Chest:     Chest wall: No tenderness.  Abdominal:     General: Bowel sounds are normal.     Palpations: Abdomen is soft.     Tenderness: There is no abdominal tenderness. There is no guarding or rebound. Negative signs include Murphy's sign and McBurney's sign.     Hernia: No hernia is present.  Musculoskeletal: Normal range of motion.  Skin:    General: Skin is warm and dry.     Findings: No rash.  Neurological:     Mental Status: She is alert and oriented to person, place, and time.     GCS: GCS eye subscore is 4. GCS verbal subscore is 5. GCS motor subscore is 6.     Cranial Nerves: No cranial nerve deficit.     Sensory: No sensory deficit.     Coordination: Coordination normal.  Psychiatric:        Speech: Speech normal.        Behavior: Behavior normal.        Thought Content: Thought content normal.      ED Treatments / Results  Labs (all labs ordered are listed, but only abnormal results are displayed) Labs Reviewed  BASIC METABOLIC PANEL - Abnormal; Notable for the following components:      Result Value   Glucose, Bld 115 (*)    All other components within normal limits  CBC - Abnormal; Notable for the following components:   Platelets 405 (*)    All other components within normal limits  HEPATIC FUNCTION PANEL - Abnormal; Notable for the following components:   Total Bilirubin 1.4 (*)    All other components within normal limits  TROPONIN I (HIGH SENSITIVITY)  TROPONIN I (HIGH SENSITIVITY)  LIPASE, BLOOD  I-STAT BETA HCG BLOOD, ED ([facility], [facility], [facility] ONLY)    EKG EKG Interpretation  Date/Time:  Tuesday July 24 2018 04:21:20 EDT Ventricular Rate:  75 PR Interval:  158 QRS Duration: 88 QT Interval:  368 QTC Calculation: 410  R Axis:   64 Text Interpretation:  Normal sinus rhythm Normal ECG Confirmed by Gilda CreasePollina, Aften Lipsey J [phone]([id number]) on 07/24/2018 6:27:30 AM   Radiology Dg Chest 2 View  Result Date: 07/24/2018 CLINICAL DATA:  Chest pain EXAM: CHEST - 2 VIEW COMPARISON:  03/06/2014 FINDINGS: Normal heart size. Mild aortic tortuosity. There is no edema, consolidation, effusion, or pneumothorax. IMPRESSION: No active cardiopulmonary disease. Electronically Signed   By: Marnee SpringJonathon  Watts  M.D.   On: 07/24/2018 04:38    Procedures Procedures (including critical care time)  Medications Ordered in ED Medications  sodium chloride flush (NS) 0.9 % injection 3 mL (has no administration in time range)  alum & mag hydroxide-simeth (MAALOX/MYLANTA) 200-200-20 MG/5ML suspension 30 mL (30 mLs Oral Given 07/24/18 0641)    And  lidocaine (XYLOCAINE) 2 % viscous mouth solution 15 mL (15 mLs Oral Given 07/24/18 0641)     Initial Impression / Assessment and Plan / ED Course  I have reviewed the triage vital signs and the nursing notes.  Pertinent labs & imaging results that were available during my care of the patient were reviewed by me and considered in my medical decision making (see chart for details).     HEAR Score: 2  Patient presents to the emergency department for evaluation of chest discomfort.  She is complaining of an ill-defined discomfort in her chest that seems like indigestion.  She reports increased belching and some bloating feeling.  No specific heart rate pain.  No shortness of breath, nausea or diaphoresis.  Patient does have history of hypertension and high cholesterol but heart score is 2, low risk.  She has had high-sensitivity troponin x2, both of which were negative.  Patient administered a GI cocktail with improvement of her symptoms, these are likely GI in nature.  As she is low risk, she can have further outpatient work-up.  She was given return precautions.  Final Clinical Impressions(s) / ED  Diagnoses   Final diagnoses:  Chest pain, unspecified type    ED Discharge Orders         Ordered    pantoprazole (PROTONIX) 40 MG tablet  Daily     07/24/18 0732           Orpah Greek, MD 07/24/18 [phone]

## 2018-08-01 ENCOUNTER — Encounter (HOSPITAL_COMMUNITY): Payer: Self-pay | Admitting: Emergency Medicine

## 2018-08-01 ENCOUNTER — Ambulatory Visit (HOSPITAL_COMMUNITY)
Admission: EM | Admit: 2018-08-01 | Discharge: 2018-08-01 | Disposition: A | Discharge status: Home / Self Care | Payer: BC Managed Care – PPO | Attending: Urgent Care | Admitting: Urgent Care

## 2018-08-01 ENCOUNTER — Other Ambulatory Visit: Payer: Self-pay

## 2018-08-01 DIAGNOSIS — R140 Abdominal distension (gaseous): Secondary | ICD-10-CM

## 2018-08-01 DIAGNOSIS — I10 Essential (primary) hypertension: Secondary | ICD-10-CM

## 2018-08-01 DIAGNOSIS — R109 Unspecified abdominal pain: Secondary | ICD-10-CM

## 2018-08-01 DIAGNOSIS — R142 Eructation: Secondary | ICD-10-CM

## 2018-08-01 LAB — POCT URINALYSIS DIP (DEVICE)
Bilirubin Urine: NEGATIVE
Glucose, UA: NEGATIVE mg/dL
Ketones, ur: NEGATIVE mg/dL
Leukocytes,Ua: NEGATIVE
Nitrite: NEGATIVE
Protein, ur: NEGATIVE mg/dL
Specific Gravity, Urine: 1.02 (ref 1.005–1.030)
Urobilinogen, UA: 1 mg/dL (ref 0.0–1.0)
pH: 7 (ref 5.0–8.0)

## 2018-08-01 MED ORDER — FAMOTIDINE 20 MG PO TABS: 20.0000 mg | ORAL_TABLET | Freq: Two times a day (BID) | ORAL | 0 refills | Status: AC

## 2018-08-01 NOTE — ED Triage Notes (Signed)
Left upper abdominal pain that started today.  Denies diarrhea Denies nausea or vomiting.   Patient feels sluggish.   Took stool softner on Thursday and had bm on friday

## 2018-08-01 NOTE — Discharge Instructions (Addendum)
For elevated blood pressure, make sure you are monitoring salt in your diet.  Do not eat restaurant foods and limit processed foods at home.  Processed foods include things like frozen meals preseason meats and dinners.  Make sure your pain attention to sodium labels on foods you by at the grocery store.  For seasoning you can use a brand called Mrs. Dash which includes a lot of salt free seasonings.  Salads - kale, spinach, cabbage, spring mix; use seeds like pumpkin seeds or sunflower seeds, almonds; you can also use 1-2 hard boiled eggs in your salads Fruits - avocadoes, berries (blueberries, raspberries, blackberries), apples, oranges, pomegranate, grapefruit Vegetables - aspargus, cauliflower, broccoli, green beans, brussel spouts, bell peppers; stay away from starchy vegetables like potatoes, carrots, peas  DO NOT EAT FOODS THAT YOU ARE ALLERGIC TO.   Please use Miralax or a fleet enema for moderate to severe constipation. Take this once a day for the next 2 days. Please also start Colace (docusate) at 50mg  stool softener, twice a day for at least 1 week. If stools become loose, cut down to once a day for another week. If stools remain loose, cut back to 1 pill every other day for a third week. You can stop docusate thereafter and resume as needed for constipation.  To help reduce constipation and promote bowel health: 1. Drink at least 64 ounces of water each day 2. Eat plenty of fiber (fruits, vegetables, whole grains, legumes) 3. Be physically active or exercise including walking, jogging, swimming, yoga, etc. 4. For active constipation use a stool softener (docusate) or an osmotic laxative (like Miralax) each day, or as needed.  Make sure you call your gastroenterologist for a follow up appointment.

## 2018-08-01 NOTE — ED Provider Notes (Signed)
MRN: [id number] DOB: 1960/09/03  Subjective:   Marcha TAISIA FANTINI is a 58 y.o. female presenting for 5 day history of mild-moderate left sided intermittent left lateral abdominal pain. Has mostly had bloating sensation, does not have a bowel movement every day. Last BM was ~2 days ago. Has also had some burping. Denies fever, n/v, diarrhea, bloody stools, chest pain, shortness of breath, confusion. Has had a colonoscopy twice. Had barium enema 03/20/2015, findings: "No definite colonic polyps or masses. No colonic strictures. Tiny 4 mm ovoid filling defect with adjacent curvilinear material in the sigmoid colon, favor minimal fecal debris, less likely a tiny polyp. Recommend correlation with colonoscopy from earlier today."  She has not set up follow-up with her PCP or gastroenterologist.  Of note, patient was seen in the emergency room and had full cardiac work-up for atypical chest pain.  Reports of mixed diet.  She has not been hydrating well and eats more carb heavy foods, drinks more soda.  No current facility-administered medications for this encounter.   Current Outpatient Medications:  .  aspirin EC 81 MG tablet, Take 81 mg by mouth daily., Disp: , Rfl:  .  atorvastatin (LIPITOR) 20 MG tablet, Take 1 tablet by mouth daily. , Disp: , Rfl: 0 .  Cholecalciferol (VITAMIN D PO), Take 1 tablet by mouth daily., Disp: , Rfl:  .  lisinopril-hydrochlorothiazide (ZESTORETIC) 10-12.5 MG tablet, Take 1 tablet by mouth daily., Disp: , Rfl:  .  pantoprazole (PROTONIX) 40 MG tablet, Take 1 tablet (40 mg total) by mouth daily., Disp: 30 tablet, Rfl: 0 .  potassium chloride SA (K-DUR,KLOR-CON) 20 MEQ tablet, Take 1 tablet (20 mEq total) by mouth 2 (two) times daily. (Patient not taking: Reported on 07/24/2018), Disp: 30 tablet, Rfl: 0    No Known Allergies  Past Medical History:  Diagnosis Date  . Hypertension      Past Surgical History:  Procedure Laterality Date  . BREAST CYST EXCISION       ROS  Objective:   Vitals: BP (!) 153/90 (BP Location: Left Arm)   Pulse 94   Temp 98.1 F (36.7 C) (Oral)   Resp 18   SpO2 98%   Physical Exam Constitutional:      General: She is not in acute distress.    Appearance: Normal appearance. She is well-developed and normal weight. She is not ill-appearing, toxic-appearing or diaphoretic.  HENT:     Head: Normocephalic and atraumatic.     Right Ear: External ear normal.     Left Ear: External ear normal.     Nose: Nose normal.     Mouth/Throat:     Mouth: Mucous membranes are moist.     Pharynx: Oropharynx is clear.  Eyes:     General: No scleral icterus.    Extraocular Movements: Extraocular movements intact.     Pupils: Pupils are equal, round, and reactive to light.  Cardiovascular:     Rate and Rhythm: Normal rate and regular rhythm.     Heart sounds: Normal heart sounds. No murmur. No friction rub. No gallop.   Pulmonary:     Effort: Pulmonary effort is normal. No respiratory distress.     Breath sounds: Normal breath sounds. No stridor. No wheezing, rhonchi or rales.  Abdominal:     General: Bowel sounds are normal. There is no distension.     Palpations: Abdomen is soft. There is no mass.     Tenderness: There is no abdominal tenderness. There is no right  CVA tenderness, left CVA tenderness, guarding or rebound. Negative signs include Murphy's sign and McBurney's sign.  Skin:    General: Skin is warm and dry.     Coloration: Skin is not pale.     Findings: No rash.  Neurological:     General: No focal deficit present.     Mental Status: She is alert and oriented to person, place, and time.  Psychiatric:        Mood and Affect: Mood normal.        Behavior: Behavior normal.        Thought Content: Thought content normal.        Judgment: Judgment normal.    Results for orders placed or performed during the hospital encounter of 08/01/18 (from the past 24 hour(s))  POCT urinalysis dip (device)     Status:  Abnormal   Collection Time: 08/01/18  7:38 PM  Result Value Ref Range   Glucose, UA NEGATIVE NEGATIVE mg/dL   Bilirubin Urine NEGATIVE NEGATIVE   Ketones, ur NEGATIVE NEGATIVE mg/dL   Specific Gravity, Urine 1.020 1.005 - 1.030   Hgb urine dipstick TRACE (A) NEGATIVE   pH 7.0 5.0 - 8.0   Protein, ur NEGATIVE NEGATIVE mg/dL   Urobilinogen, UA 1.0 0.0 - 1.0 mg/dL   Nitrite NEGATIVE NEGATIVE   Leukocytes,Ua NEGATIVE NEGATIVE    Assessment and Plan :   1. Left flank pain   2. Abdominal bloating   3. Left sided abdominal pain   4. Burping   5. Essential hypertension    Patient's physical exam findings very reassuring.  Counseled on lifestyle modifications including avoidance of GERD causing foods and better hydration.  We will have patient start Pepcid to address her GERD, maintain Protonix as prescribed from her last ER visit at the end of June.  Recommended she is set up follow-up with her PCP and gastroenterologist for continued management. Counseled patient on potential for adverse effects with medications prescribed/recommended today, ER and return-to-clinic precautions discussed, patient verbalized understanding.    Wallis BambergMani, [place] Bowlds, PA-C 08/01/18 2019

## 2018-08-04 ENCOUNTER — Other Ambulatory Visit: Payer: Self-pay

## 2018-08-04 DIAGNOSIS — Z20822 Contact with and (suspected) exposure to covid-19: Secondary | ICD-10-CM

## 2018-08-10 LAB — NOVEL CORONAVIRUS, NAA: SARS-CoV-2, NAA: NOT DETECTED

## 2018-08-23 ENCOUNTER — Encounter: Payer: Self-pay | Admitting: Neurology

## 2018-09-03 ENCOUNTER — Encounter (HOSPITAL_COMMUNITY): Payer: Self-pay | Admitting: Emergency Medicine

## 2018-09-03 ENCOUNTER — Other Ambulatory Visit: Payer: Self-pay

## 2018-09-03 ENCOUNTER — Emergency Department (HOSPITAL_COMMUNITY)
Admission: EM | Admit: 2018-09-03 | Discharge: 2018-09-04 | Disposition: A | Discharge status: Home / Self Care | Payer: BC Managed Care – PPO | Attending: Emergency Medicine | Admitting: Emergency Medicine

## 2018-09-03 DIAGNOSIS — R202 Paresthesia of skin: Secondary | ICD-10-CM | POA: Insufficient documentation

## 2018-09-03 DIAGNOSIS — Z7982 Long term (current) use of aspirin: Secondary | ICD-10-CM | POA: Insufficient documentation

## 2018-09-03 DIAGNOSIS — Z79899 Other long term (current) drug therapy: Secondary | ICD-10-CM | POA: Insufficient documentation

## 2018-09-03 DIAGNOSIS — I10 Essential (primary) hypertension: Secondary | ICD-10-CM | POA: Insufficient documentation

## 2018-09-03 DIAGNOSIS — R1010 Upper abdominal pain, unspecified: Secondary | ICD-10-CM | POA: Diagnosis present

## 2018-09-03 LAB — CBC
HCT: 38.4 % (ref 36.0–46.0)
Hemoglobin: 12.4 g/dL (ref 12.0–15.0)
MCH: 28.5 pg (ref 26.0–34.0)
MCHC: 32.3 g/dL (ref 30.0–36.0)
MCV: 88.3 fL (ref 80.0–100.0)
Platelets: 402 10*3/uL — ABNORMAL HIGH (ref 150–400)
RBC: 4.35 MIL/uL (ref 3.87–5.11)
RDW: 13.8 % (ref 11.5–15.5)
WBC: 8.7 10*3/uL (ref 4.0–10.5)
nRBC: 0 % (ref 0.0–0.2)

## 2018-09-03 NOTE — ED Notes (Signed)
Patient reports "burning pain" to abdomen, that radiates to flank and also reports feeling the same pain in her head and down into her arms, states she has had similar pain before and was seen at urgent care then her pcp and referred to a neurologist which she has not seen yet. Pt a/ox 4, resp e/u, nad. Speech clear, pt moves all limbs equally. Face symmetrical.

## 2018-09-03 NOTE — ED Triage Notes (Signed)
Pt reports ongoing left flank and abdominal pain. Seen for same "a few times over the last month"

## 2018-09-03 NOTE — ED Notes (Signed)
Pt ambulated to restroom with nad and steady gait.

## 2018-09-03 NOTE — ED Provider Notes (Signed)
[facility]ENC[facility] DEPARTMENT Provider Note   CSN: [id number] Arrival date & time: 09/03/18  1511    History   Chief Complaint Chief Complaint  Patient presents with  . Flank Pain    HPI Rachel Cochran is a 58 y.o. female.     HPI Patient presents to the emergency room for evaluation of various complaints that have been going on and off for several months.  Patient states she is having intermittent episodes of some burning discomfort in her upper abdomen.  It will moved to her left flank.  These episodes come and go and have been ongoing for several months.  She is not having any fevers or vomiting.  She does not have any pain currently.  Patient saw her primary care doctor.  She had laboratory test.  She also had a tele-visit with gastroenterology.  Patient states she is also having a burning and tingling discomfort is on her face as well as her bilateral upper extremities.  She also has symptoms down her lower extremities at times.  These all episodes also come and go.  Patient saw her doctor and is scheduled to see a neurologist.  Patient had another episode today while at work so she came to the ED for evaluation.  She denies any fevers.  No vomiting or diarrhea.  No difficulty with her speech or coordination. Past Medical History:  Diagnosis Date  . Hypertension     There are no active problems to display for this patient.   Past Surgical History:  Procedure Laterality Date  . BREAST CYST EXCISION       OB History   No obstetric history on file.      Home Medications    Prior to Admission medications   Medication Sig Start Date End Date Taking? Authorizing Provider  aspirin EC 81 MG tablet Take 81 mg by mouth daily.    [provider]  atorvastatin (LIPITOR) 20 MG tablet Take 1 tablet by mouth daily.  03/01/14   [provider]  Cholecalciferol (VITAMIN D PO) Take 1 tablet by mouth daily.    [provider]   famotidine (PEPCID) 20 MG tablet Take 1 tablet (20 mg total) by mouth 2 (two) times daily. 08/01/18   Jaynee Eagles, PA-C  lisinopril-hydrochlorothiazide (ZESTORETIC) 10-12.5 MG tablet Take 1 tablet by mouth daily. 06/19/18   [provider]  pantoprazole (PROTONIX) 40 MG tablet Take 1 tablet (40 mg total) by mouth daily. 07/24/18   Orpah Greek, MD  potassium chloride SA (K-DUR,KLOR-CON) 20 MEQ tablet Take 1 tablet (20 mEq total) by mouth 2 (two) times daily. Patient not taking: Reported on 1/88/4166 0/63/01   Delora Fuel, MD    Family History No family history on file.  Social History Social History   Tobacco Use  . Smoking status: Never Smoker  Substance Use Topics  . Alcohol use: No  . Drug use: Not Currently     Allergies   Patient has no known allergies.   Review of Systems Review of Systems  All other systems reviewed and are negative.    Physical Exam Updated Vital Signs BP (!) 159/95 (BP Location: Right Arm)   Pulse 88   Temp 99 F (37.2 C) (Oral)   Resp 16   SpO2 96%   Physical Exam Vitals signs and nursing note reviewed.  Constitutional:      General: She is not in acute distress.    Appearance: She is well-developed.  HENT:     Head: Normocephalic and atraumatic.     Right Ear: External ear normal.     Left Ear: External ear normal.  Eyes:     General: No scleral icterus.       Right eye: No discharge.        Left eye: No discharge.     Conjunctiva/sclera: Conjunctivae normal.  Neck:     Musculoskeletal: Neck supple.     Trachea: No tracheal deviation.  Cardiovascular:     Rate and Rhythm: Normal rate and regular rhythm.  Pulmonary:     Effort: Pulmonary effort is normal. No respiratory distress.     Breath sounds: Normal breath sounds. No stridor. No wheezing or rales.  Abdominal:     General: Bowel sounds are normal. There is no distension.     Palpations: Abdomen is soft.     Tenderness: There is no abdominal tenderness.  There is no guarding or rebound.  Musculoskeletal:        General: No tenderness.  Skin:    General: Skin is warm and dry.     Findings: No rash.  Neurological:     Mental Status: She is alert and oriented to person, place, and time.     Cranial Nerves: No cranial nerve deficit (No facial droop, extraocular movements intact, tongue midline ).     Sensory: No sensory deficit.     Motor: No abnormal muscle tone or seizure activity.     Coordination: Coordination normal.     Comments: No pronator drift bilateral upper extrem, able to hold both legs off bed for 5 seconds, sensation intact in all extremities, no visual field cuts, no left or right sided neglect, normal finger-nose exam bilaterally, no nystagmus noted       ED Treatments / Results  Labs (all labs ordered are listed, but only abnormal results are displayed) Labs Reviewed  CBC  COMPREHENSIVE METABOLIC PANEL  LIPASE, BLOOD  URINALYSIS, ROUTINE W REFLEX MICROSCOPIC  TSH  T4, FREE     Procedures Procedures (including critical care time)  Medications Ordered in ED Medications - No data to display   Initial Impression / Assessment and Plan / ED Course  I have reviewed the triage vital signs and the nursing notes.  Pertinent labs & imaging results that were available during my care of the patient were reviewed by me and considered in my medical decision making (see chart for details).  Patient has several various complaints.  In the ED she appears comfortable in no distress.  She has a normal neurologic exam.  She has no abdominal tenderness on exam.  Patient has been waiting in the emergency room for several hours prior to evaluation.  At this point I think it is reasonable to check some laboratory tests but overall I do not see any acute neurologic deficits and she has no abdominal tenderness.  I do not think any brain or abdominal imaging are necessary at this time.  Care turned over to Dr Pilar PlateBero. Final Clinical  Impressions(s) / ED Diagnoses   Final diagnoses:  Paresthesia       Linwood DibblesKnapp, Linzie Boursiquot, MD 09/03/18 2322

## 2018-09-03 NOTE — ED Triage Notes (Signed)
Pt reports that she has followed up with her primary care doctor, seen the GI doctor she was referred to and has follow up appointments scheduled. And just wants "an overall check up"

## 2018-09-04 ENCOUNTER — Ambulatory Visit
Admission: RE | Admit: 2018-09-04 | Discharge: 2018-09-04 | Disposition: A | Discharge status: Home / Self Care | Payer: BLUE CROSS/BLUE SHIELD | Source: Ambulatory Visit | Attending: Family Medicine | Admitting: Family Medicine

## 2018-09-04 DIAGNOSIS — Z1231 Encounter for screening mammogram for malignant neoplasm of breast: Secondary | ICD-10-CM

## 2018-09-04 LAB — URINALYSIS, ROUTINE W REFLEX MICROSCOPIC
Bilirubin Urine: NEGATIVE
Glucose, UA: NEGATIVE mg/dL
Hgb urine dipstick: NEGATIVE
Ketones, ur: 80 mg/dL — AB
Leukocytes,Ua: NEGATIVE
Nitrite: NEGATIVE
Protein, ur: NEGATIVE mg/dL
Specific Gravity, Urine: 1.015 (ref 1.005–1.030)
pH: 6 (ref 5.0–8.0)

## 2018-09-04 LAB — COMPREHENSIVE METABOLIC PANEL
ALT: 17 U/L (ref 0–44)
AST: 19 U/L (ref 15–41)
Albumin: 4.1 g/dL (ref 3.5–5.0)
Alkaline Phosphatase: 63 U/L (ref 38–126)
Anion gap: 12 (ref 5–15)
BUN: 12 mg/dL (ref 6–20)
CO2: 26 mmol/L (ref 22–32)
Calcium: 9.2 mg/dL (ref 8.9–10.3)
Chloride: 99 mmol/L (ref 98–111)
Creatinine, Ser: 0.72 mg/dL (ref 0.44–1.00)
GFR calc Af Amer: >60 mL/min (ref >60)
GFR calc non Af Amer: >60 mL/min (ref >60)
Glucose, Bld: 97 mg/dL (ref 70–99)
Potassium: 3.4 mmol/L — ABNORMAL LOW (ref 3.5–5.1)
Sodium: 137 mmol/L (ref 135–145)
Total Bilirubin: 1.7 mg/dL — ABNORMAL HIGH (ref 0.3–1.2)
Total Protein: 7.9 g/dL (ref 6.5–8.1)

## 2018-09-04 LAB — LIPASE, BLOOD: Lipase: 22 U/L (ref 11–51)

## 2018-09-04 LAB — T4, FREE: Free T4: 1.19 ng/dL — ABNORMAL HIGH (ref 0.61–1.12)

## 2018-09-04 LAB — TSH: TSH: 0.869 u[IU]/mL (ref 0.350–4.500)

## 2018-09-04 NOTE — ED Notes (Signed)
Patient verbalized understanding of dc instructions, vss, ambulatory with nad.   

## 2018-09-04 NOTE — ED Provider Notes (Signed)
  Provider Note MRN:  [id number]  Arrival date & time: 09/04/18    ED Course and Medical Decision Making  Assumed care from Dr. Tomi Bamberger at shift change.  Work-up is unrevealing, patient is appropriate for outpatient management.  After the discussed management above, the patient was determined to be safe for discharge.  The patient was in agreement with this plan and all questions regarding their care were answered.  ED return precautions were discussed and the patient will return to the ED with any significant worsening of condition.  Final Clinical Impressions(s) / ED Diagnoses     ICD-10-CM   1. Paresthesia  R20.2     ED Discharge Orders    None       Barth Kirks. Sedonia Small, [place] [email or web address]    Maudie Flakes, MD 09/04/18 [phone]

## 2018-09-04 NOTE — Discharge Instructions (Addendum)
You were evaluated in the Emergency Department and after careful evaluation, we did not find any emergent condition requiring admission or further testing in the hospital.  Your testing today was reassuring with no abnormalities.  As discussed, we recommend that you keep your follow-up appointments with neurology.  Please return to the Emergency Department if you experience any worsening of your condition.  We encourage you to follow up with a primary care provider.  Thank you for allowing [place] to be a part of your care.

## 2018-10-18 ENCOUNTER — Encounter: Payer: Self-pay | Admitting: Neurology

## 2018-10-19 ENCOUNTER — Other Ambulatory Visit: Payer: Self-pay | Admitting: Physician Assistant

## 2018-10-19 DIAGNOSIS — R1084 Generalized abdominal pain: Secondary | ICD-10-CM

## 2018-10-22 ENCOUNTER — Encounter: Payer: Self-pay | Admitting: Neurology

## 2018-10-22 ENCOUNTER — Other Ambulatory Visit (INDEPENDENT_AMBULATORY_CARE_PROVIDER_SITE_OTHER): Payer: BC Managed Care – PPO

## 2018-10-22 ENCOUNTER — Encounter

## 2018-10-22 ENCOUNTER — Ambulatory Visit: Payer: BC Managed Care – PPO | Admitting: Neurology

## 2018-10-22 ENCOUNTER — Other Ambulatory Visit: Payer: Self-pay

## 2018-10-22 VITALS — BP 140/90 | HR 78 | Ht 62.0 in | Wt 134.0 lb

## 2018-10-22 DIAGNOSIS — R202 Paresthesia of skin: Secondary | ICD-10-CM

## 2018-10-22 DIAGNOSIS — R209 Unspecified disturbances of skin sensation: Secondary | ICD-10-CM

## 2018-10-22 NOTE — Progress Notes (Signed)
[facility] Clinic Note - Initial Visit   Date: 10/22/18  Rachel Cochran MRN: [id number] DOB: 01/30/1960   Dear Dr. Tamala Julian:  Thank you for your kind referral of Rachel Cochran for consultation of paresthesias. Although her history is well known to you, please allow [place] to reiterate it for the purpose of our medical record. The patient was accompanied to the clinic by self.   History of Present Illness: Rachel Cochran is a 58 y.o. right-handed female with hypertension, hyperlipidemia, GERD,  presenting for evaluation of generalized numbness/tingling.   Starting around early July 2020, she began experiencing numbness/tingling of the face and scalp.  Sometimes, it also involves the arms and legs in a generalized pattern.  There is no focal area of involvement.  Symptoms occur about once per week, lasting a few hours.  She feels that the cold temperature aggravates her symptoms.  The only thing that helps her symptoms is prayer.  She does not have weakness, headache, or vision changes.  She is unsure whether stress could be contributing.  Prior testing has included TSH, vitamin B12, folate, and CMP which has been normal.  Out-side paper records, electronic medical record, and images have been reviewed where available and summarized as:  Labs 08/15/2018:  Vitamin B12 345, TSH 0.84, CMP normal, vitamin D 47.7, folate 10.8  Lab Results  Component Value Date   TSH 0.869 09/03/2018    Past Medical History:  Diagnosis Date  . Acid reflux   . Constipation   . Fibrocystic breast disease   . Hx of colonic polyps   . Hypertension   . Osteopenia   . Vitamin D deficiency     Past Surgical History:  Procedure Laterality Date  . BREAST BIOPSY Left    benign  . BREAST CYST EXCISION Right   . COLONOSCOPY       Medications:  Outpatient Encounter Medications as of 10/22/2018  Medication Sig Note  . aspirin EC 81 MG tablet Take 81 mg by mouth  daily.   Marland Kitchen atorvastatin (LIPITOR) 20 MG tablet Take 20 mg by mouth daily.  03/06/2014: .   Marland Kitchen CALCIUM PO Take 1 tablet by mouth daily.   . Cholecalciferol (VITAMIN D PO) Take 1 tablet by mouth daily.   . famotidine (PEPCID) 20 MG tablet Take 1 tablet (20 mg total) by mouth 2 (two) times daily.   Marland Kitchen lisinopril-hydrochlorothiazide (ZESTORETIC) 10-12.5 MG tablet Take 1 tablet by mouth daily.   . pantoprazole (PROTONIX) 40 MG tablet Take 1 tablet (40 mg total) by mouth daily. (Patient not taking: Reported on 10/22/2018)   . potassium chloride SA (K-DUR,KLOR-CON) 20 MEQ tablet Take 1 tablet (20 mEq total) by mouth 2 (two) times daily. (Patient not taking: Reported on 07/24/2018)    No facility-administered encounter medications on file as of 10/22/2018.     Allergies: No Known Allergies  Family History: Family History  Problem Relation Age of Onset  . CVA Mother   . Dementia Mother   . Heart attack Mother   . CVA Father   . Hypertension Sister   . Diabetes Sister     Social History: Social History   Tobacco Use  . Smoking status: Never Smoker  . Smokeless tobacco: Never Used  Substance Use Topics  . Alcohol use: No  . Drug use: Not Currently   Social History   Social History Narrative   2 children, works at [employer] patient is married one story home  Right handed     Review of Systems:  CONSTITUTIONAL: No fevers, chills, night sweats, or weight loss.   EYES: No visual changes or eye pain ENT: No hearing changes.  No history of nose bleeds.   RESPIRATORY: No cough, wheezing and shortness of breath.   CARDIOVASCULAR: Negative for chest pain, and palpitations.   GI: +for abdominal discomfort, blood in stools or black stools.  No recent change in bowel habits.   GU:  No history of incontinence.   MUSCLOSKELETAL: No history of joint pain or swelling.  No myalgias.   SKIN: Negative for lesions, rash, and itching.   HEMATOLOGY/ONCOLOGY: Negative for prolonged  bleeding, bruising easily, and swollen nodes.  No history of cancer.   ENDOCRINE: Negative for cold or heat intolerance, polydipsia or goiter.   PSYCH:  No depression +anxiety symptoms.   NEURO: As Above.   Vital Signs:  BP 140/90   Pulse 78   Ht  (1.575 m)   Wt 134 lb (60.8 kg)   SpO2 98%   BMI 24.51 kg/m    General Medical Exam:   General:  Well appearing, comfortable.   Eyes/ENT: see cranial nerve examination.   Neck:   No carotid bruits. Respiratory:  Clear to auscultation, good air entry bilaterally.   Cardiac:  Regular rate and rhythm, no murmur.   Extremities:  No deformities, edema, or skin discoloration.  Skin:  No rashes or lesions.  Neurological Exam: MENTAL STATUS including orientation to time, place, person, recent and remote memory, attention span and concentration, language, and fund of knowledge is normal.  Speech is not dysarthric.  CRANIAL NERVES: II:  No visual field defects.  Unremarkable fundi.   III-IV-VI: Pupils equal round and reactive to light.  Normal conjugate, extra-ocular eye movements in all directions of gaze.  No nystagmus.  No ptosis.   V:  Normal facial sensation.    VII:  Normal facial symmetry and movements.   VIII:  Normal hearing and vestibular function.   IX-X:  Normal palatal movement.   XI:  Normal shoulder shrug and head rotation.   XII:  Normal tongue strength and range of motion, no deviation or fasciculation.  MOTOR:  No atrophy, fasciculations or abnormal movements.  No pronator drift.   Upper Extremity:  Right  Left  Deltoid  5/5   5/5   Biceps  5/5   5/5   Triceps  5/5   5/5   Infraspinatus 5/5  5/5  Medial pectoralis 5/5  5/5  Wrist extensors  5/5   5/5   Wrist flexors  5/5   5/5   Finger extensors  5/5   5/5   Finger flexors  5/5   5/5   Dorsal interossei  5/5   5/5   Abductor pollicis  5/5   5/5   Tone (Ashworth scale)  0  0   Lower Extremity:  Right  Left  Hip flexors  5/5   5/5   Hip extensors  5/5   5/5    Adductor 5/5  5/5  Abductor 5/5  5/5  Knee flexors  5/5   5/5   Knee extensors  5/5   5/5   Dorsiflexors  5/5   5/5   Plantarflexors  5/5   5/5   Toe extensors  5/5   5/5   Toe flexors  5/5   5/5   Tone (Ashworth scale)  0  0   MSRs:  Right  Left                  brachioradialis 2+  2+  biceps 2+  2+  triceps 2+  2+  patellar 2+  2+  ankle jerk 2+  2+  Hoffman no  no  plantar response down  down   SENSORY:  Normal and symmetric perception of light touch, pinprick, vibration, and proprioception.  Romberg's sign absent.   COORDINATION/GAIT: Normal finger-to- nose-finger and heel-to-shin.  Intact rapid alternating movements bilaterally.  Able to rise from a chair without using arms.  Gait narrow based and stable. Tandem and stressed gait intact.    IMPRESSION: Migratory paresthesias over the face, arms, and legs.  Symptoms do not conform to a cutaneous nerve or dermatomal distribution.  Her neurological exam is normal, which is reassuring.  I suspect these are nonspecific paresthesias, however, to be certain demyelinating disease is missed, she will undergo MRI brain with and without contrast.  Check vitamin B1, MMA, and copper for nutritional deficiencies causing paresthesias.  Patient was instructed to try to identify any specific triggers.  Further recommendations pending results.  Thank you for allowing me to participate in patient's care.  If I can answer any additional questions, I would be pleased to do so.    Sincerely,    Claramae Rigdon K. Allena Katz, DO

## 2018-10-22 NOTE — Patient Instructions (Addendum)
We will order MRI brain wwo contrast We have sent a referral to [facility] for your MRI and they will call you directly to schedule your appointment. They are located at [place]. If you need to contact them directly please call [phone].  Check labs Your provider has requested that you have labwork completed today. Please go to [facility] ([id number]) on the second floor of this building before leaving the office today. You do not need to check in. If you are not called within 15 minutes please check with the front desk. We will call you with the results

## 2018-10-24 ENCOUNTER — Ambulatory Visit
Admission: RE | Admit: 2018-10-24 | Discharge: 2018-10-24 | Disposition: A | Discharge status: Home / Self Care | Payer: BC Managed Care – PPO | Source: Ambulatory Visit | Attending: Physician Assistant | Admitting: Physician Assistant

## 2018-10-24 DIAGNOSIS — R1084 Generalized abdominal pain: Secondary | ICD-10-CM

## 2018-10-25 LAB — METHYLMALONIC ACID, SERUM: Methylmalonic Acid, Quant: 73 nmol/L — ABNORMAL LOW (ref 87–318)

## 2018-10-25 LAB — COPPER, SERUM: Copper: 132 ug/dL (ref 70–175)

## 2018-10-25 LAB — VITAMIN B1: Vitamin B1 (Thiamine): 8 nmol/L (ref 8–30)

## 2018-11-04 ENCOUNTER — Ambulatory Visit
Admission: RE | Admit: 2018-11-04 | Discharge: 2018-11-04 | Disposition: A | Discharge status: Home / Self Care | Payer: BC Managed Care – PPO | Source: Ambulatory Visit | Attending: Neurology | Admitting: Neurology

## 2018-11-04 ENCOUNTER — Other Ambulatory Visit: Payer: Self-pay

## 2018-11-04 DIAGNOSIS — R202 Paresthesia of skin: Secondary | ICD-10-CM

## 2018-11-04 MED ORDER — GADOBENATE DIMEGLUMINE 529 MG/ML IV SOLN
10.0000 mL | Freq: Once | INTRAVENOUS | Status: AC | PRN
  Administered 2018-11-04: 10 mL via INTRAVENOUS

## 2018-11-05 ENCOUNTER — Telehealth: Payer: Self-pay

## 2018-11-05 NOTE — Telephone Encounter (Signed)
-----   Message from Alda Berthold, DO sent at 11/05/2018  7:44 AM EDT ----- Please inform patient that her MRI brain is normal - no sign of anything worrisome such as MS, stroke, or tumor.  Thanks.

## 2018-11-05 NOTE — Telephone Encounter (Signed)
Follow up in 2-3 months

## 2018-11-05 NOTE — Telephone Encounter (Signed)
Pt.notified

## 2018-11-05 NOTE — Telephone Encounter (Signed)
I informed patient of results, she would like to know when you would like to see her back for follow up

## 2019-02-04 ENCOUNTER — Other Ambulatory Visit (HOSPITAL_COMMUNITY)
Admission: RE | Admit: 2019-02-04 | Discharge: 2019-02-04 | Disposition: A | Discharge status: Home / Self Care | Payer: 59 | Source: Ambulatory Visit | Attending: Family Medicine | Admitting: Family Medicine

## 2019-02-04 ENCOUNTER — Other Ambulatory Visit: Payer: Self-pay | Admitting: Family Medicine

## 2019-02-04 DIAGNOSIS — Z124 Encounter for screening for malignant neoplasm of cervix: Secondary | ICD-10-CM | POA: Diagnosis not present

## 2019-02-05 LAB — CYTOLOGY - PAP
Comment: NEGATIVE
Diagnosis: NEGATIVE
High risk HPV: NEGATIVE

## 2019-09-25 ENCOUNTER — Other Ambulatory Visit: Payer: Self-pay | Admitting: Family Medicine

## 2019-09-25 DIAGNOSIS — Z1231 Encounter for screening mammogram for malignant neoplasm of breast: Secondary | ICD-10-CM

## 2019-10-04 ENCOUNTER — Other Ambulatory Visit: Payer: Self-pay

## 2019-10-04 ENCOUNTER — Ambulatory Visit
Admission: RE | Admit: 2019-10-04 | Discharge: 2019-10-04 | Disposition: A | Discharge status: Home / Self Care | Payer: No Typology Code available for payment source | Source: Ambulatory Visit | Attending: Family Medicine | Admitting: Family Medicine

## 2019-10-04 DIAGNOSIS — Z1231 Encounter for screening mammogram for malignant neoplasm of breast: Secondary | ICD-10-CM

## 2020-10-22 ENCOUNTER — Other Ambulatory Visit: Payer: Self-pay | Admitting: Family Medicine

## 2020-10-22 DIAGNOSIS — Z1231 Encounter for screening mammogram for malignant neoplasm of breast: Secondary | ICD-10-CM

## 2020-10-23 ENCOUNTER — Ambulatory Visit: Payer: No Typology Code available for payment source

## 2020-11-04 ENCOUNTER — Ambulatory Visit
Admission: RE | Admit: 2020-11-04 | Discharge: 2020-11-04 | Disposition: A | Discharge status: Home / Self Care | Payer: No Typology Code available for payment source | Source: Ambulatory Visit | Attending: Family Medicine | Admitting: Family Medicine

## 2020-11-04 ENCOUNTER — Other Ambulatory Visit: Payer: Self-pay

## 2020-11-04 DIAGNOSIS — Z1231 Encounter for screening mammogram for malignant neoplasm of breast: Secondary | ICD-10-CM

## 2021-06-12 IMAGING — DX CHEST - 2 VIEW
2 series · 2 of 2 positions shown · non-contrast
Comparison: 03/06/2014

CLINICAL DATA: Chest pain

EXAM:
CHEST - 2 VIEW

[chest pa]
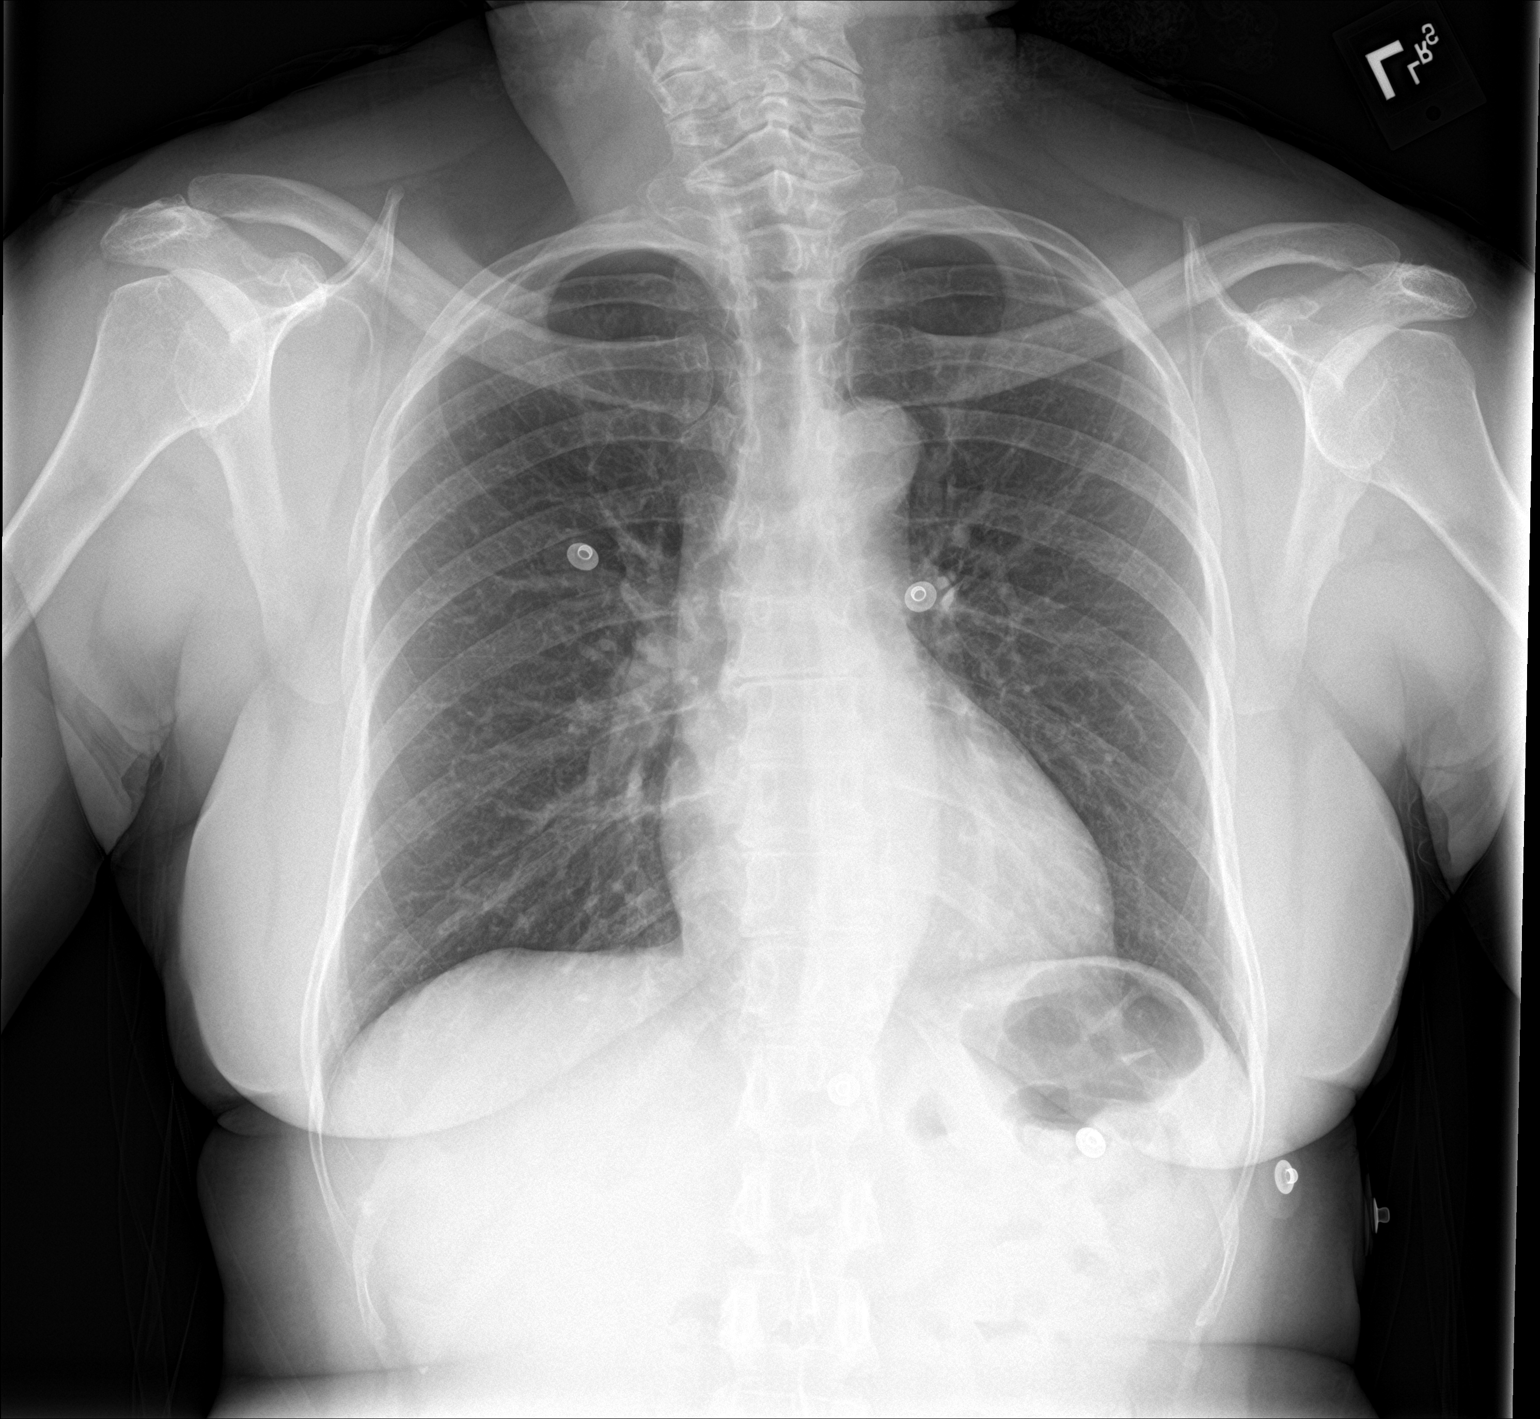

[chest lat]
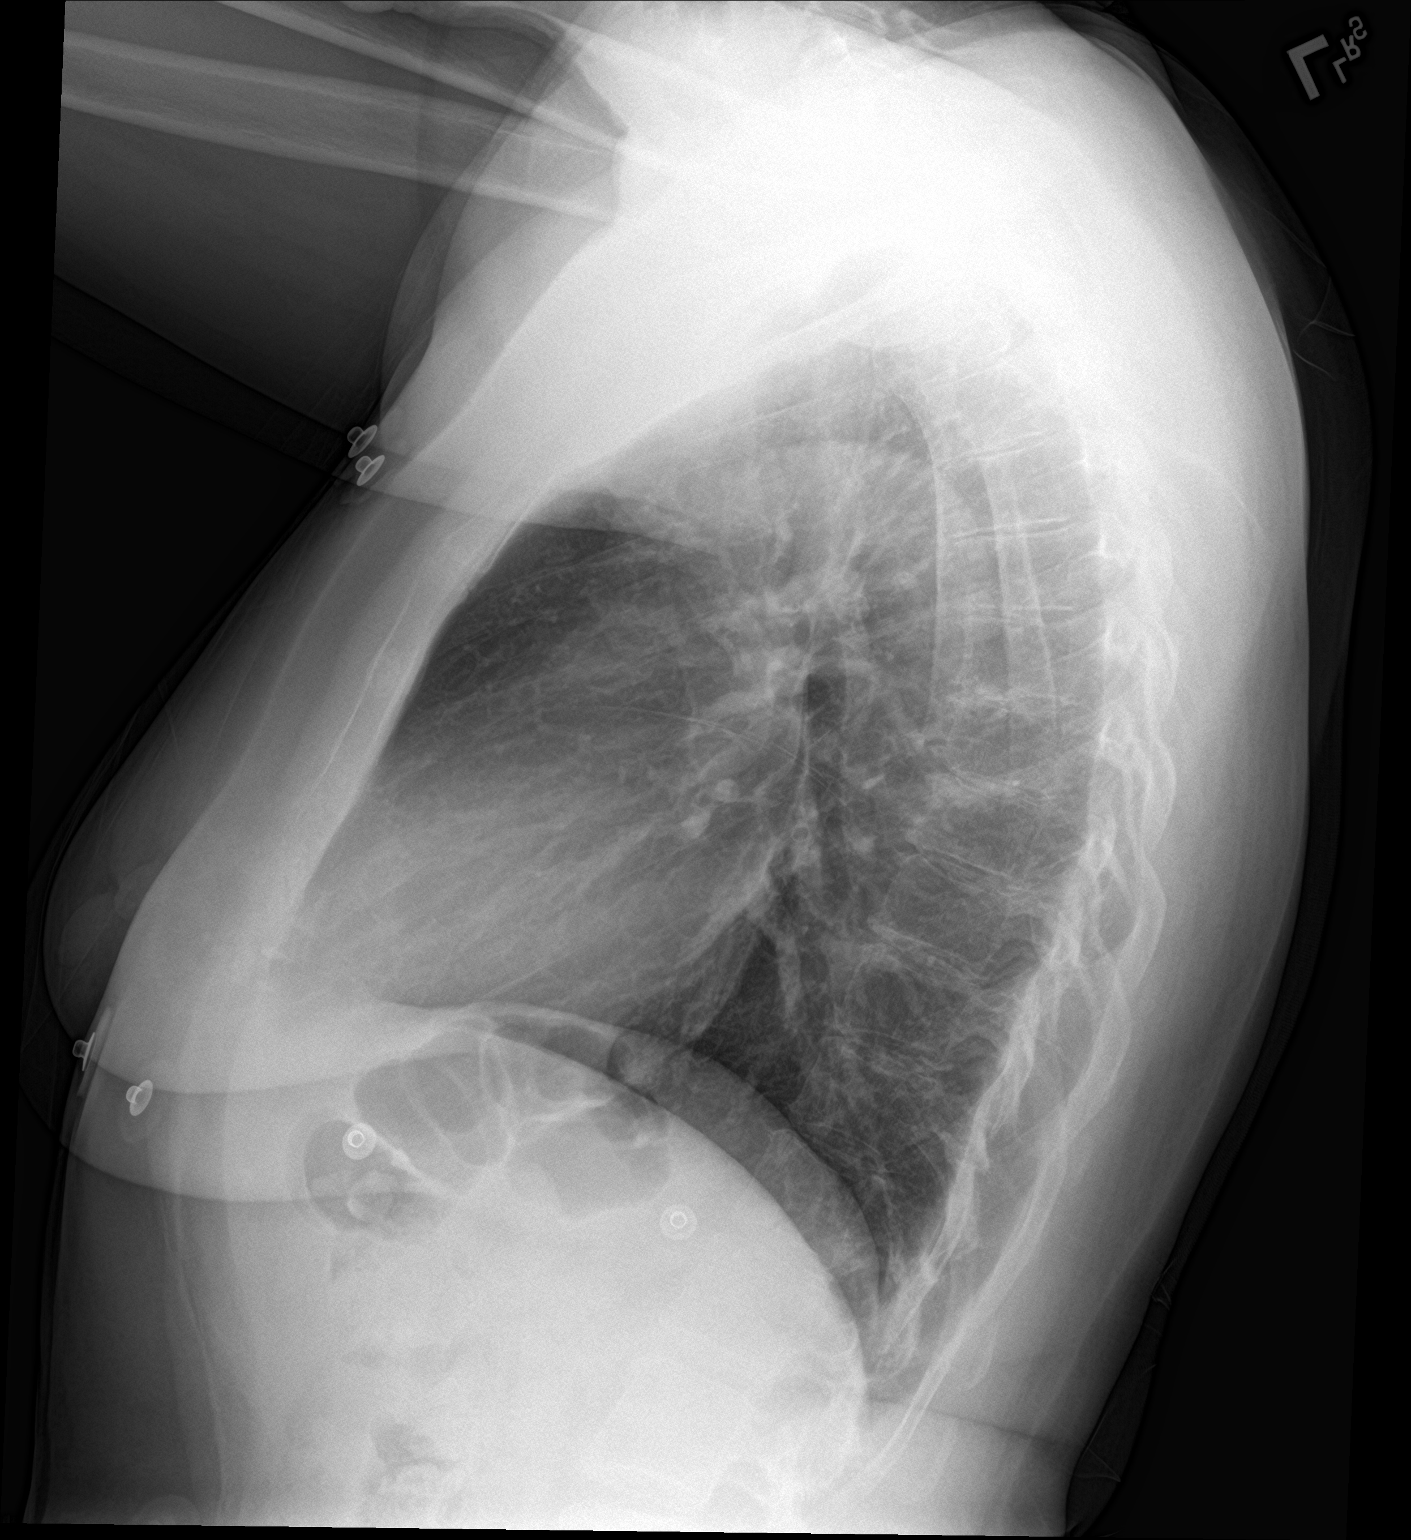

[2 of 2 positions shown; findings below may reference images not displayed]

FINDINGS: Normal heart size. Mild aortic tortuosity. There is no edema,
consolidation, effusion, or pneumothorax.
IMPRESSION: No active cardiopulmonary disease.

## 2021-07-24 IMAGING — MG DIGITAL SCREENING BILATERAL MAMMOGRAM WITH TOMO AND CAD
8 series · 9 of 24 positions shown · non-contrast
Comparison: Previous exam(s).

CLINICAL DATA: Screening.

EXAM:
DIGITAL SCREENING BILATERAL MAMMOGRAM WITH TOMO AND CAD

[R CC synth-2D]
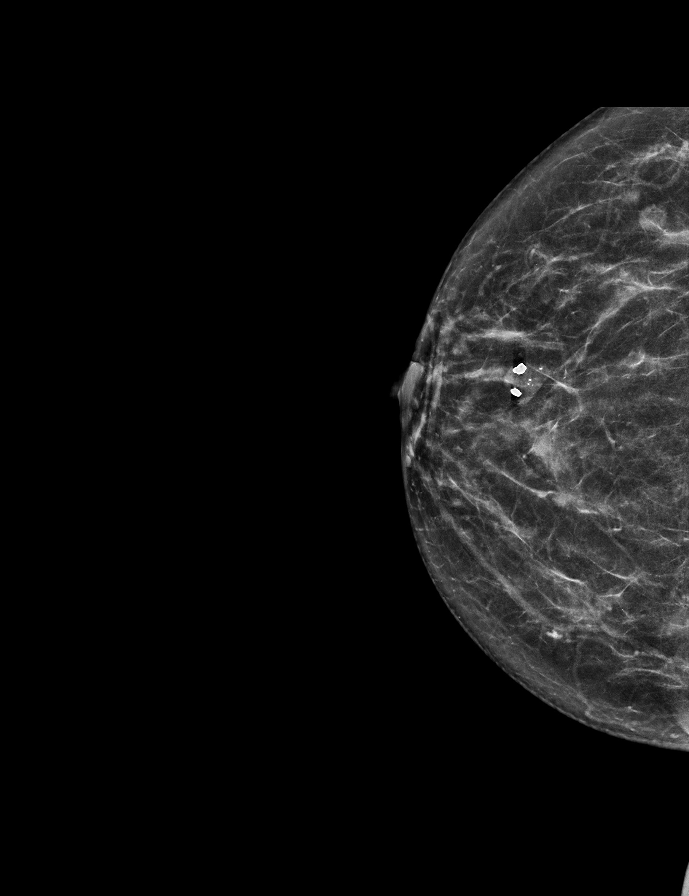

[R MLO synth-2D]
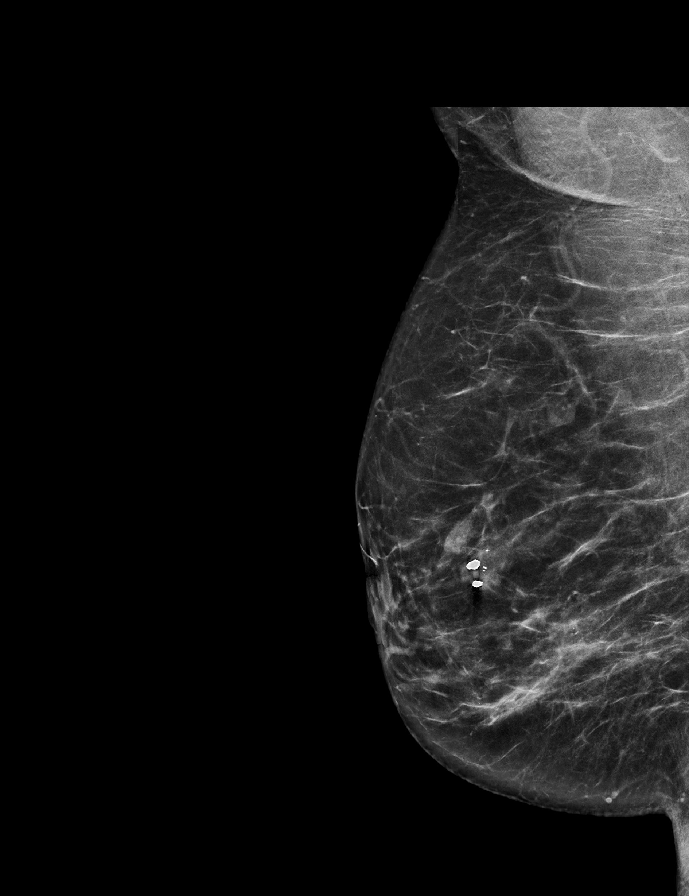

[L MLO synth-2D]
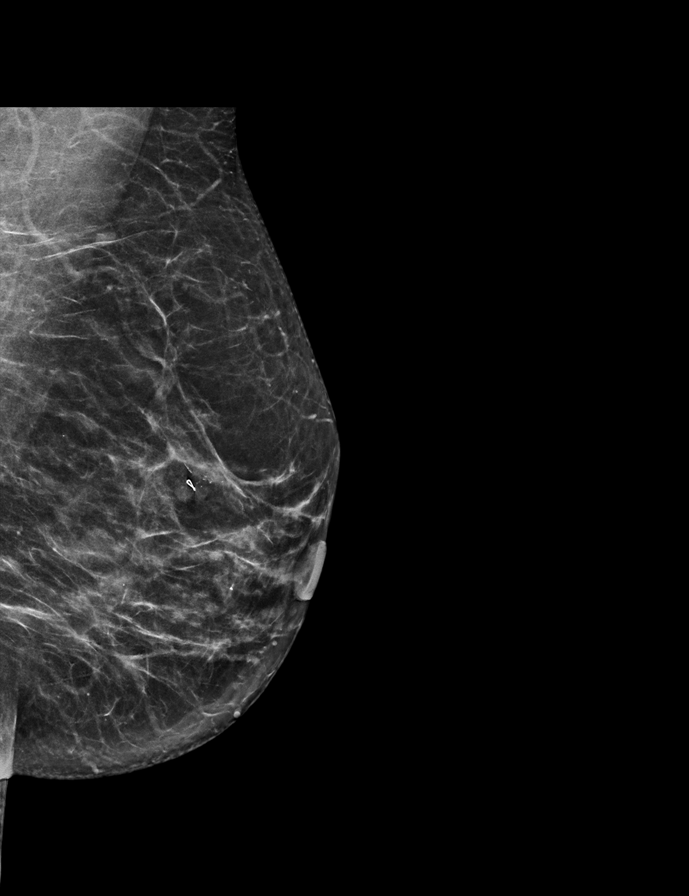

[L CC synth-2D]
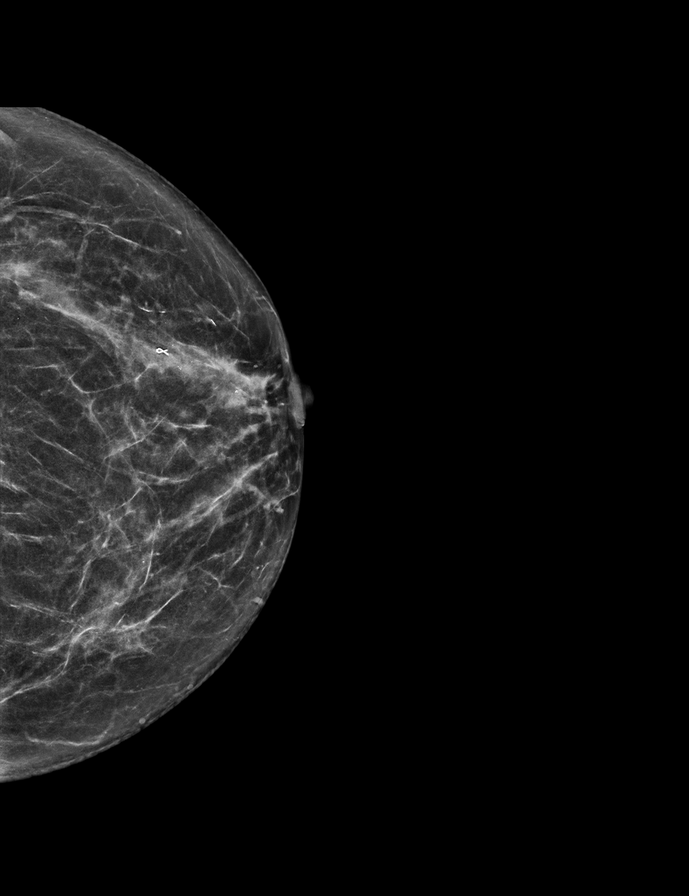

[R CC tomo · 2 of 58 frames shown]
[frame 19/58]
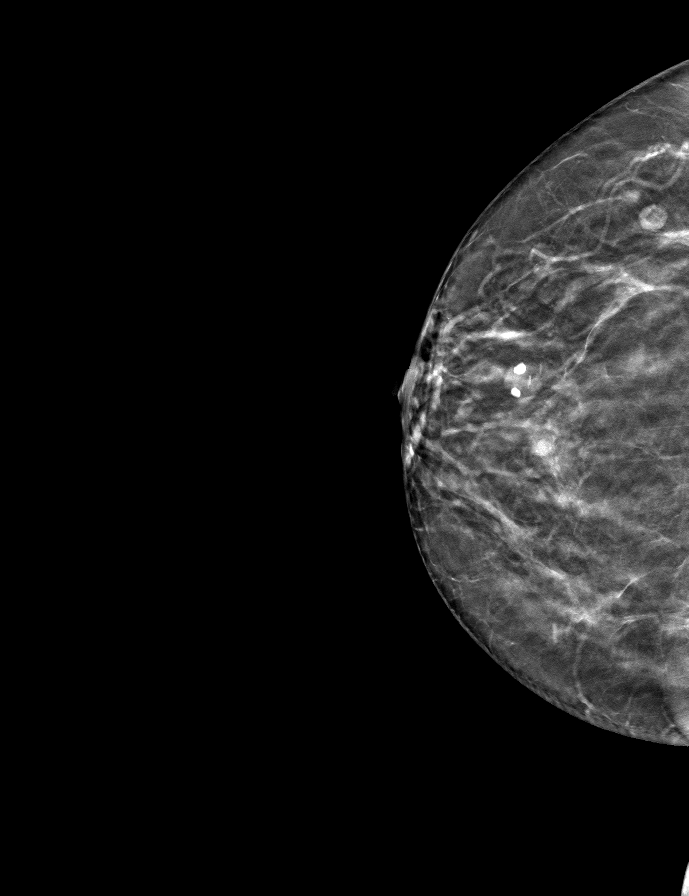
[frame 29/58]
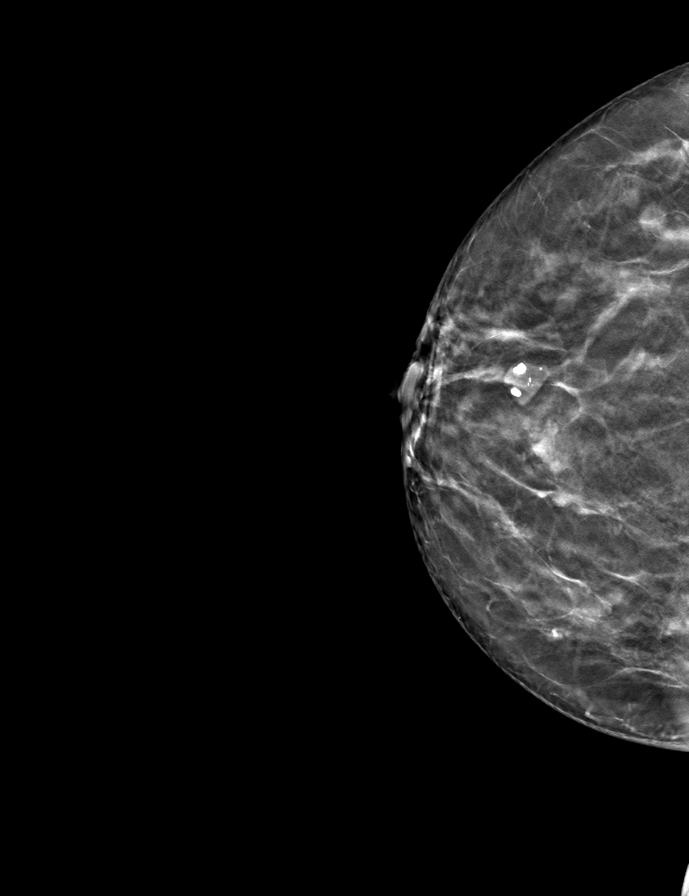

[L MLO tomo · tomo slice 31/62.0]
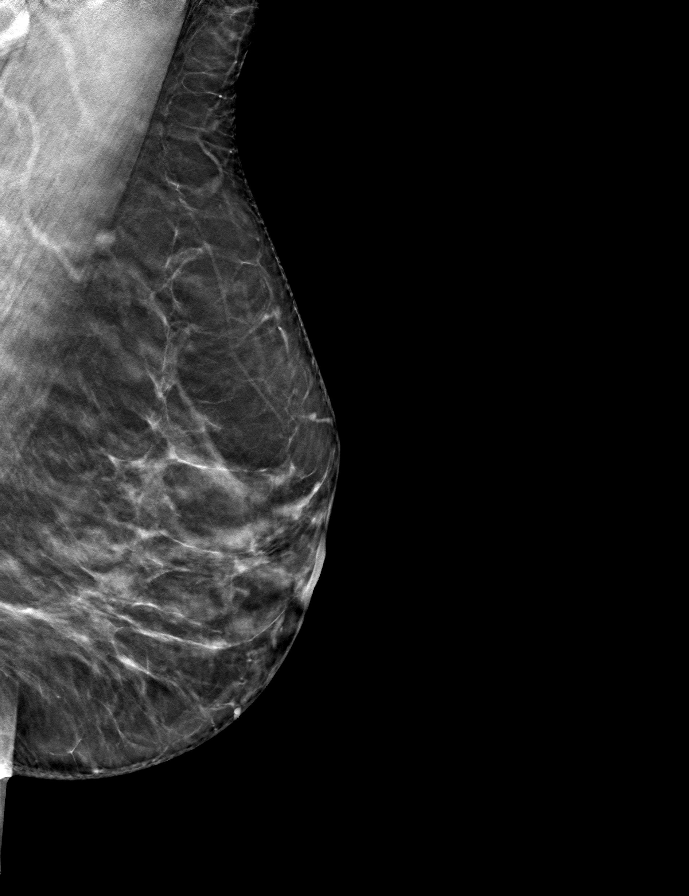

[R MLO tomo · tomo slice 33/66.0]
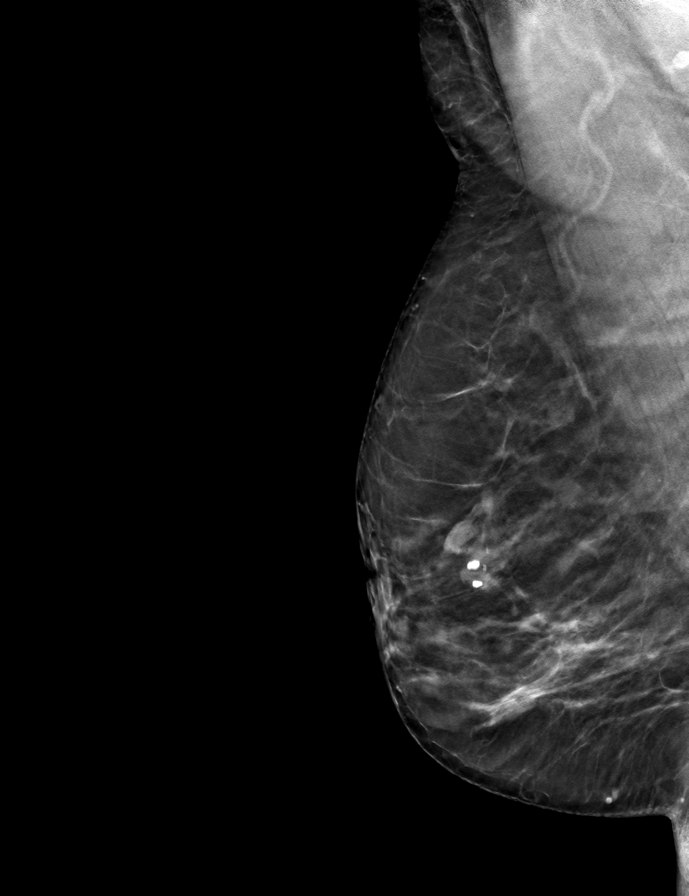

[L CC tomo · tomo slice 29/58.0]
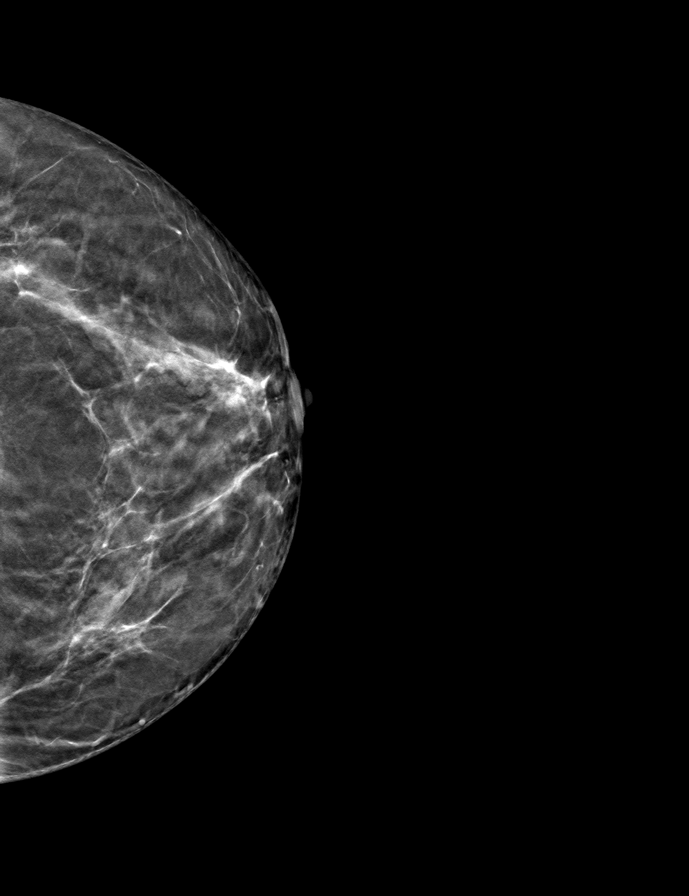

[9 of 24 positions shown; findings below may reference images not displayed]

ACR Breast Density Category b: There are scattered areas of
fibroglandular density.
FINDINGS: There are no findings suspicious for malignancy. Images were
processed with CAD.
IMPRESSION: No mammographic evidence of malignancy. A result letter of this
screening mammogram will be mailed directly to the patient.

RECOMMENDATION:
Screening mammogram in one year. (Code:[id number])

BI-RADS CATEGORY  1: Negative.

## 2021-12-22 ENCOUNTER — Other Ambulatory Visit: Payer: Self-pay | Admitting: Family Medicine

## 2021-12-22 DIAGNOSIS — Z1231 Encounter for screening mammogram for malignant neoplasm of breast: Secondary | ICD-10-CM

## 2022-02-15 ENCOUNTER — Ambulatory Visit
Admission: RE | Admit: 2022-02-15 | Discharge: 2022-02-15 | Disposition: A | Discharge status: Home / Self Care | Payer: No Typology Code available for payment source | Source: Ambulatory Visit | Attending: Family Medicine | Admitting: Family Medicine

## 2022-02-15 DIAGNOSIS — Z1231 Encounter for screening mammogram for malignant neoplasm of breast: Secondary | ICD-10-CM

## 2022-03-17 ENCOUNTER — Encounter (HOSPITAL_COMMUNITY): Payer: Self-pay | Admitting: Emergency Medicine

## 2022-03-17 ENCOUNTER — Ambulatory Visit (HOSPITAL_COMMUNITY): Payer: No Typology Code available for payment source

## 2022-03-17 ENCOUNTER — Ambulatory Visit (HOSPITAL_COMMUNITY)
Admission: EM | Admit: 2022-03-17 | Discharge: 2022-03-17 | Disposition: A | Discharge status: Home / Self Care | Payer: No Typology Code available for payment source | Attending: Emergency Medicine | Admitting: Emergency Medicine

## 2022-03-17 DIAGNOSIS — J0140 Acute pansinusitis, unspecified: Secondary | ICD-10-CM

## 2022-03-17 DIAGNOSIS — R052 Subacute cough: Secondary | ICD-10-CM | POA: Diagnosis not present

## 2022-03-17 DIAGNOSIS — I10 Essential (primary) hypertension: Secondary | ICD-10-CM | POA: Diagnosis not present

## 2022-03-17 DIAGNOSIS — R059 Cough, unspecified: Secondary | ICD-10-CM | POA: Diagnosis not present

## 2022-03-17 MED ORDER — AMOXICILLIN-POT CLAVULANATE 875-125 MG PO TABS: 1.0000 | ORAL_TABLET | Freq: Two times a day (BID) | ORAL | 0 refills | Status: DC

## 2022-03-17 MED ORDER — IPRATROPIUM BROMIDE 0.06 % NA SOLN: 2.0000 | Freq: Four times a day (QID) | NASAL | 0 refills | Status: AC

## 2022-03-17 NOTE — ED Triage Notes (Signed)
Pt reports all month had cough, congestion, wheezing in chest. Reports when lays down at night gets worse. Can hear crackling sound in ears. Took Mucinex and allergy meds.  Reports teeth will her as well.

## 2022-03-17 NOTE — ED Provider Notes (Signed)
HPI  SUBJECTIVE:  Rachel Cochran is a 62 y.o. female who presents with nasal congestion for a month, white thick mucus when she blows her nose.  She reports sinus pain and pressure, upper dental pain, postnasal drip, intermittent cough productive of the same material as her nasal congestion, wheezing worse at night, chest congestion.  She states that her GERD has been bothering her.  No fevers, facial swelling, chest pain, shortness of breath.  No lower extremity edema, unintentional weight gain, nocturia, orthopnea, PND, abdominal pain.  Questionable allergy symptoms of rhinorrhea and watery eyes.  She is able to sleep at night without waking up coughing.  She has been taking famotidine intermittently with improvement in the GERD symptoms, and allergy medication, Mucinex without improvement.  Her postnasal drip and cough are worse at night when she lies down.  She has a past medical history of GERD, hypertension, hypercholesterolemia.  No history of pulmonary disease, CHF, smoking.  PCP: [facility].    Past Medical History:  Diagnosis Date   Acid reflux    Constipation    Fibrocystic breast disease    Hx of colonic polyps    Hypertension    Osteopenia    Vitamin D deficiency     Past Surgical History:  Procedure Laterality Date   BREAST BIOPSY Left    benign   BREAST CYST EXCISION Right    COLONOSCOPY      Family History  Problem Relation Age of Onset   CVA Mother    Dementia Mother    Heart attack Mother    CVA Father    Hypertension Sister    Diabetes Sister     Social History   Tobacco Use   Smoking status: Never   Smokeless tobacco: Never  Vaping Use   Vaping Use: Never used  Substance Use Topics   Alcohol use: No   Drug use: Not Currently    No current facility-administered medications for this encounter.  Current Outpatient Medications:    amoxicillin-clavulanate (AUGMENTIN) 875-125 MG tablet, Take 1 tablet by mouth every 12 (twelve) hours.,  Disp: 14 tablet, Rfl: 0   ipratropium (ATROVENT) 0.06 % nasal spray, Place 2 sprays into both nostrils 4 (four) times daily., Disp: 15 mL, Rfl: 0   aspirin EC 81 MG tablet, Take 81 mg by mouth daily., Disp: , Rfl:    atorvastatin (LIPITOR) 20 MG tablet, Take 20 mg by mouth daily. , Disp: , Rfl: 0   CALCIUM PO, Take 1 tablet by mouth daily., Disp: , Rfl:    Cholecalciferol (VITAMIN D PO), Take 1 tablet by mouth daily., Disp: , Rfl:    famotidine (PEPCID) 20 MG tablet, Take 1 tablet (20 mg total) by mouth 2 (two) times daily., Disp: 60 tablet, Rfl: 0   lisinopril-hydrochlorothiazide (ZESTORETIC) 10-12.5 MG tablet, Take 1 tablet by mouth daily., Disp: , Rfl:    pantoprazole (PROTONIX) 40 MG tablet, Take 1 tablet (40 mg total) by mouth daily. (Patient not taking: Reported on 10/22/2018), Disp: 30 tablet, Rfl: 0   potassium chloride SA (K-DUR,KLOR-CON) 20 MEQ tablet, Take 1 tablet (20 mEq total) by mouth 2 (two) times daily. (Patient not taking: Reported on 07/24/2018), Disp: 30 tablet, Rfl: 0  No Known Allergies   ROS  As noted in HPI.   Physical Exam  BP (!) 163/103 (BP Location: Left Arm)   Pulse 91   Temp 98.9 F (37.2 C) (Oral)   Resp 16   SpO2 96%  BP Readings from Last 3 Encounters:  03/17/22 (!) 163/103  10/22/18 140/90  09/04/18 112/78    Constitutional: Well developed, well nourished, no acute distress Eyes:  EOMI, conjunctiva normal bilaterally HENT: Normocephalic, atraumatic,mucus membranes moist.  Positive nasal congestion.  Erythematous, swollen turbinates.  No maxillary, frontal sinus tenderness.  Positive postnasal drip. Neck: No cervical lymphadenopathy Respiratory: Normal inspiratory effort, lungs clear bilaterally, good air movement Cardiovascular: Normal rate and regular rhythm, no murmurs rubs or gallops GI: nondistended skin: No rash, skin intact Musculoskeletal: no deformities, no edema Neurologic: Alert & oriented x 3, no focal neuro  deficits Psychiatric: Speech and behavior appropriate   ED Course   Medications - No data to display  Orders Placed This Encounter  Procedures   DG Chest 2 View    Standing Status:   Standing    Number of Occurrences:   1    Order Specific Question:   Reason for Exam (SYMPTOM  OR DIAGNOSIS REQUIRED)    Answer:   Cough, wheezing for 1 month.  Rule out pneumonia, CHF, other acute cardiopulmonary process.    No results found for this or any previous visit (from the past 24 hour(s)). DG Chest 2 View  Result Date: 03/17/2022 CLINICAL DATA:  Cough EXAM: CHEST - 2 VIEW COMPARISON:  07/24/18 FINDINGS: No pleural effusion. No pneumothorax. No focal airspace opacity. Normal cardiac and mediastinal contours. No radiographically apparent displaced rib fractures. Visualized upper abdomen is unremarkable. Vertebral body heights are maintained. IMPRESSION: No active cardiopulmonary disease. Electronically Signed   By: Marin Roberts M.D.   On: 03/17/2022 19:13    ED Clinical Impression  1. Acute non-recurrent pansinusitis   2. Subacute cough   3. Elevated blood pressure reading with diagnosis of hypertension      ED Assessment/Plan     1.  Patient is describing nasal congestion, sinus pain and pressure, upper dental pain.  She has postnasal drip on exam.  I suspect that her cough is coming from a combination of the sinus infection and/or acid reflux, however, will check a chest x-ray to rule out any other cardiopulmonary process since it has been present for a month.  Reviewed imaging independently.  Normal chest x-ray.  See radiology report for full details.  X-ray negative for pneumonia or any other acute cardiopulmonary process.  Will treat as a sinusitis with Augmentin, Atrovent nasal spray, saline nasal irrigation.  Will also try and address acid reflux that may be contributing to her symptoms.  Advised her to elevate the head of her bed and start the famotidine on a daily basis.  2.   Elevated blood pressure reading with diagnosis of hypertension.  Blood pressure noted.  Patient states that she missed yesterday's dose, but took today's dose of blood pressure medications.  She has no historical signs of a hypertensive emergency.  She is to keep an eye on her blood pressure by measuring her blood pressure once a day with her arm cuff at home and keeping a log of this.  She will follow-up with her PCP if remains persistently elevated.   Discussed imaging, MDM, treatment plan, and plan for follow-up with patient.  patient agrees with plan.   Meds ordered this encounter  Medications   amoxicillin-clavulanate (AUGMENTIN) 875-125 MG tablet    Sig: Take 1 tablet by mouth every 12 (twelve) hours.    Dispense:  14 tablet    Refill:  0   ipratropium (ATROVENT) 0.06 % nasal spray    Sig:  Place 2 sprays into both nostrils 4 (four) times daily.    Dispense:  15 mL    Refill:  0      *This clinic note was created using [employer]. Therefore, there may be occasional mistakes despite careful proofreading.  ?    Melynda Ripple, MD 03/18/22 1413

## 2022-03-17 NOTE — Discharge Instructions (Addendum)
Your chest x-ray is negative for pneumonia.  I suspect that your cough is coming from a sinus infection and possibly acid reflux.  Finish the Augmentin, even if you feel better.  Start saline nasal irrigation with a [employer]Med sinus rinse and distilled water as often as you want, Atrovent nasal spray.  Elevate head of your bed to 30 degrees and take the famotidine 20 mg twice a day on a regular basis.   Decrease your salt intake. diet and exercise will lower your blood pressure significantly. It is important to keep your blood pressure under good control, as having a elevated blood pressure for prolonged periods of time significantly increases your risk of stroke, heart attacks, kidney damage, eye damage, and other problems. Get a validated blood pressure cuff that goes on your arm, not your wrist.  Measure your blood pressure once a day, preferably at the same time every day. Keep a log of this and bring it to your next doctor's appointment.  Return immediately to the ER if you start having chest pain, headache, problems seeing, problems talking, problems walking, if you feel like you're about to pass out, if you do pass out, if you have a seizure, or for any other concerns.  Go to www.goodrx.com  or www.costplusdrugs.com to look up your medications. This will give you a list of where you can find your prescriptions at the most affordable prices. Or ask the pharmacist what the cash price is, or if they have any other discount programs available to help make your medication more affordable. This can be less expensive than what you would pay with insurance.

## 2023-01-12 ENCOUNTER — Ambulatory Visit (HOSPITAL_COMMUNITY)
Admission: EM | Admit: 2023-01-12 | Discharge: 2023-01-12 | Disposition: A | Discharge status: Home / Self Care | Payer: No Typology Code available for payment source | Attending: Physician Assistant | Admitting: Physician Assistant

## 2023-01-12 ENCOUNTER — Encounter (HOSPITAL_COMMUNITY): Payer: Self-pay

## 2023-01-12 DIAGNOSIS — J069 Acute upper respiratory infection, unspecified: Secondary | ICD-10-CM | POA: Diagnosis not present

## 2023-01-12 MED ORDER — BENZONATATE 100 MG PO CAPS: 100.0000 mg | ORAL_CAPSULE | Freq: Three times a day (TID) | ORAL | 0 refills | Status: AC

## 2023-01-12 MED ORDER — PROMETHAZINE-DM 6.25-15 MG/5ML PO SYRP: 5.0000 mL | ORAL_SOLUTION | Freq: Every evening | ORAL | 0 refills | Status: AC | PRN

## 2023-01-12 NOTE — ED Provider Notes (Signed)
[facility]   MRN: [id number] DOB: July 01, 1960  Subjective:   Darla H. Rodrick is a 62 y.o. female presenting for nasal congestion, cough, slight sore throat, and hoarseness x 3 days. She denies any sick contacts. She denies fever or chills. No headaches or dizziness. No chest pain or SOB. She has been taking Mucinex with minimal relief.   No current facility-administered medications for this encounter.  Current Outpatient Medications:    aspirin EC 81 MG tablet, Take 81 mg by mouth daily., Disp: , Rfl:    atorvastatin (LIPITOR) 20 MG tablet, Take 20 mg by mouth daily. , Disp: , Rfl: 0   benzonatate (TESSALON) 100 MG capsule, Take 1 capsule (100 mg total) by mouth every 8 (eight) hours. Take for cough., Disp: 21 capsule, Rfl: 0   CALCIUM PO, Take 1 tablet by mouth daily., Disp: , Rfl:    Cholecalciferol (VITAMIN D PO), Take 1 tablet by mouth daily., Disp: , Rfl:    famotidine (PEPCID) 20 MG tablet, Take 1 tablet (20 mg total) by mouth 2 (two) times daily., Disp: 60 tablet, Rfl: 0   ipratropium (ATROVENT) 0.06 % nasal spray, Place 2 sprays into both nostrils 4 (four) times daily., Disp: 15 mL, Rfl: 0   lisinopril-hydrochlorothiazide (ZESTORETIC) 10-12.5 MG tablet, Take 1 tablet by mouth daily., Disp: , Rfl:    pantoprazole (PROTONIX) 40 MG tablet, Take 1 tablet (40 mg total) by mouth daily. (Patient not taking: Reported on 10/22/2018), Disp: 30 tablet, Rfl: 0   potassium chloride SA (K-DUR,KLOR-CON) 20 MEQ tablet, Take 1 tablet (20 mEq total) by mouth 2 (two) times daily. (Patient not taking: Reported on 07/24/2018), Disp: 30 tablet, Rfl: 0   promethazine-dextromethorphan (PROMETHAZINE-DM) 6.25-15 MG/5ML syrup, Take 5 mLs by mouth at bedtime as needed for cough., Disp: 120 mL, Rfl: 0   No Known Allergies  Past Medical History:  Diagnosis Date   Acid reflux    Constipation    Fibrocystic breast disease    Hx of colonic polyps    Hypertension    Osteopenia     Vitamin D deficiency      Past Surgical History:  Procedure Laterality Date   BREAST BIOPSY Left    benign   BREAST CYST EXCISION Right    COLONOSCOPY      Family History  Problem Relation Age of Onset   CVA Mother    Dementia Mother    Heart attack Mother    CVA Father    Hypertension Sister    Diabetes Sister     Social History   Tobacco Use   Smoking status: Never   Smokeless tobacco: Never  Vaping Use   Vaping status: Never Used  Substance Use Topics   Alcohol use: No   Drug use: Not Currently    ROS REFER TO HPI FOR PERTINENT POSITIVES AND NEGATIVES   Objective:   Vitals: BP 128/84 (BP Location: Left Arm)   Pulse 95   Temp 98.9 F (37.2 C) (Oral)   Resp 18   Ht 5\' 2"  (1.575 m)   Wt 150 lb (68 kg)   SpO2 94%   BMI 27.44 kg/m   Physical Exam Vitals and nursing note reviewed.  Constitutional:      General: She is not in acute distress.    Appearance: Normal appearance. She is not ill-appearing.  HENT:     Head: Normocephalic.     Right Ear: Tympanic membrane, ear canal and external ear normal.  Left Ear: Tympanic membrane, ear canal and external ear normal.     Nose: Congestion (minimal) present.     Mouth/Throat:     Mouth: Mucous membranes are moist.     Pharynx: No oropharyngeal exudate or posterior oropharyngeal erythema.     Comments: hoarseness Eyes:     Extraocular Movements: Extraocular movements intact.     Conjunctiva/sclera: Conjunctivae normal.     Pupils: Pupils are equal, round, and reactive to light.  Cardiovascular:     Rate and Rhythm: Normal rate and regular rhythm.     Pulses: Normal pulses.     Heart sounds: Normal heart sounds. No murmur heard. Pulmonary:     Effort: Pulmonary effort is normal. No respiratory distress.     Breath sounds: Normal breath sounds. No wheezing.  Musculoskeletal:     Cervical back: Normal range of motion.  Skin:    General: Skin is warm.  Neurological:     Mental Status: She is alert  and oriented to person, place, and time.  Psychiatric:        Mood and Affect: Mood normal.        Behavior: Behavior normal.     No results found for this or any previous visit (from the past 24 hours).  Assessment and Plan :   PDMP not reviewed this encounter.  1. Acute upper respiratory infection    Reassured pt presentation c/w viral URI. Conservative tx as discussed and written in AVS. Tessalon perles and promethazine DM as directed for added relief. Return precautions discussed.     Bary Leriche, PA-C 01/12/23 1746

## 2023-01-12 NOTE — Discharge Instructions (Signed)
You have a viral upper respiratory infection. This type of infection does not require antibiotics. Symptoms should improve over the next 7 to 10 days.   Some things that can make you feel better are: - Increased rest - Increasing fluid with water/sugar free electrolytes - Acetaminophen and ibuprofen as needed for fever/pain - Salt water gargling, chloraseptic spray and throat lozenges for sore throat - OTC guaifenesin (Mucinex) 600 mg twice daily for congestion - Saline sinus flushes or a neti pot - Humidifying the air -Tessalon perles during the day for cough -Promethazine-DM at night as needed for cough

## 2023-01-12 NOTE — ED Triage Notes (Addendum)
Pt presents with nasal congestion and cough since Monday 12/16. Pt states "on Monday, my throat was a little sore but it has resolved now. My voice is coming and going & my ears feel itchy." Pt currently denies pain. Pt reports she has been taking Mucinex and another OTC medication (unsure of name) with little to no improvement in symptoms. Pt denies fevers at home.

## 2023-02-24 ENCOUNTER — Other Ambulatory Visit: Payer: Self-pay | Admitting: Family Medicine

## 2023-02-24 DIAGNOSIS — Z1231 Encounter for screening mammogram for malignant neoplasm of breast: Secondary | ICD-10-CM

## 2023-03-03 ENCOUNTER — Ambulatory Visit
Admission: RE | Admit: 2023-03-03 | Discharge: 2023-03-03 | Disposition: A | Discharge status: Home / Self Care | Payer: No Typology Code available for payment source | Source: Ambulatory Visit | Attending: Family Medicine | Admitting: Family Medicine

## 2023-03-03 DIAGNOSIS — Z1231 Encounter for screening mammogram for malignant neoplasm of breast: Secondary | ICD-10-CM

## 2023-04-17 ENCOUNTER — Other Ambulatory Visit: Payer: Self-pay | Admitting: Family Medicine

## 2023-04-17 DIAGNOSIS — M8580 Other specified disorders of bone density and structure, unspecified site: Secondary | ICD-10-CM

## 2023-04-17 DIAGNOSIS — E2839 Other primary ovarian failure: Secondary | ICD-10-CM

## 2024-01-23 ENCOUNTER — Other Ambulatory Visit

## 2024-02-29 ENCOUNTER — Other Ambulatory Visit: Payer: Self-pay | Admitting: Family Medicine

## 2024-02-29 DIAGNOSIS — Z1231 Encounter for screening mammogram for malignant neoplasm of breast: Secondary | ICD-10-CM

## 2024-03-12 ENCOUNTER — Ambulatory Visit
